# Patient Record
Sex: Male | Born: 1935 | Race: White | Hispanic: No | Smoking: Former smoker
Health system: Southern US, Community
[De-identification: ages and names within clinical notes are randomized; demographics above are authoritative.]

## PROBLEM LIST (undated history)

## (undated) DIAGNOSIS — I509 Heart failure, unspecified: Secondary | ICD-10-CM

## (undated) DIAGNOSIS — M4800 Spinal stenosis, site unspecified: Secondary | ICD-10-CM

## (undated) DIAGNOSIS — H4020X0 Unspecified primary angle-closure glaucoma, stage unspecified: Secondary | ICD-10-CM

## (undated) DIAGNOSIS — T754XXA Electrocution, initial encounter: Secondary | ICD-10-CM

## (undated) DIAGNOSIS — E119 Type 2 diabetes mellitus without complications: Secondary | ICD-10-CM

## (undated) DIAGNOSIS — I4892 Unspecified atrial flutter: Secondary | ICD-10-CM

## (undated) DIAGNOSIS — N200 Calculus of kidney: Secondary | ICD-10-CM

## (undated) DIAGNOSIS — N319 Neuromuscular dysfunction of bladder, unspecified: Secondary | ICD-10-CM

## (undated) DIAGNOSIS — M1990 Unspecified osteoarthritis, unspecified site: Secondary | ICD-10-CM

## (undated) DIAGNOSIS — I2510 Atherosclerotic heart disease of native coronary artery without angina pectoris: Secondary | ICD-10-CM

## (undated) HISTORY — PX: BILATERAL HEMI SHOULDER ARTHROPLASTY: SHX6442

## (undated) HISTORY — PX: GLAUCOMA SURGERY: SHX656

## (undated) HISTORY — PX: LUMBAR SPINE SURGERY: SHX701

## (undated) HISTORY — PX: CYSTOSCOPY: SUR368

---

## 1990-08-14 HISTORY — PX: BALLOON ANGIOPLASTY, ARTERY: SHX564

## 1994-08-14 HISTORY — PX: CORONARY ARTERY BYPASS GRAFT: SHX141

## 1998-04-07 ENCOUNTER — Encounter: Admission: RE | Admit: 1998-04-07 | Discharge: 1998-06-30 | Payer: Self-pay | Admitting: Anesthesiology

## 1998-04-28 ENCOUNTER — Encounter: Payer: Self-pay | Admitting: Anesthesiology

## 1998-05-13 ENCOUNTER — Encounter: Admission: RE | Admit: 1998-05-13 | Discharge: 1998-08-11 | Payer: Self-pay | Admitting: Anesthesiology

## 1998-05-17 ENCOUNTER — Encounter: Payer: Self-pay | Admitting: Anesthesiology

## 1998-06-30 ENCOUNTER — Encounter: Admission: RE | Admit: 1998-06-30 | Discharge: 1998-09-16 | Payer: Self-pay | Admitting: Anesthesiology

## 1998-09-16 ENCOUNTER — Encounter: Admission: RE | Admit: 1998-09-16 | Discharge: 1998-12-15 | Payer: Self-pay | Admitting: Anesthesiology

## 1998-11-30 ENCOUNTER — Encounter: Payer: Self-pay | Admitting: Anesthesiology

## 1998-12-14 ENCOUNTER — Encounter: Payer: Self-pay | Admitting: Anesthesiology

## 1998-12-24 ENCOUNTER — Encounter: Admission: RE | Admit: 1998-12-24 | Discharge: 1999-03-14 | Payer: Self-pay | Admitting: Anesthesiology

## 1999-03-15 ENCOUNTER — Encounter: Admission: RE | Admit: 1999-03-15 | Discharge: 1999-06-13 | Payer: Self-pay | Admitting: Anesthesiology

## 1999-04-13 ENCOUNTER — Encounter: Payer: Self-pay | Admitting: Neurosurgery

## 1999-04-13 ENCOUNTER — Ambulatory Visit (HOSPITAL_COMMUNITY): Admission: RE | Admit: 1999-04-13 | Discharge: 1999-04-13 | Payer: Self-pay | Admitting: Neurosurgery

## 2000-03-29 ENCOUNTER — Encounter: Admission: RE | Admit: 2000-03-29 | Discharge: 2000-03-29 | Payer: Self-pay | Admitting: Neurosurgery

## 2000-03-29 ENCOUNTER — Encounter: Payer: Self-pay | Admitting: Neurosurgery

## 2001-01-03 ENCOUNTER — Ambulatory Visit (HOSPITAL_COMMUNITY): Admission: RE | Admit: 2001-01-03 | Discharge: 2001-01-03 | Payer: Self-pay | Admitting: Neurosurgery

## 2001-01-03 ENCOUNTER — Encounter: Payer: Self-pay | Admitting: Neurosurgery

## 2001-02-11 ENCOUNTER — Ambulatory Visit (HOSPITAL_COMMUNITY): Admission: RE | Admit: 2001-02-11 | Discharge: 2001-02-11 | Payer: Self-pay | Admitting: Neurosurgery

## 2001-02-11 ENCOUNTER — Encounter: Payer: Self-pay | Admitting: Neurosurgery

## 2003-01-24 ENCOUNTER — Encounter: Payer: Self-pay | Admitting: Neurosurgery

## 2003-01-24 ENCOUNTER — Ambulatory Visit (HOSPITAL_COMMUNITY): Admission: RE | Admit: 2003-01-24 | Discharge: 2003-01-24 | Payer: Self-pay | Admitting: Neurosurgery

## 2008-02-03 ENCOUNTER — Encounter: Admission: RE | Admit: 2008-02-03 | Discharge: 2008-02-03 | Payer: Self-pay | Admitting: Neurosurgery

## 2008-06-09 ENCOUNTER — Inpatient Hospital Stay (HOSPITAL_COMMUNITY): Admission: RE | Admit: 2008-06-09 | Discharge: 2008-06-13 | Payer: Self-pay | Admitting: Neurosurgery

## 2009-04-13 ENCOUNTER — Encounter: Admission: RE | Admit: 2009-04-13 | Discharge: 2009-04-13 | Payer: Self-pay | Admitting: Neurosurgery

## 2010-12-27 NOTE — H&P (Signed)
[name], [name] NO.:  [id number]   MEDICAL RECORD NO.:  [id number]          PATIENT TYPE:  INP   LOCATION:  3302                         FACILITY:  [facility]   PHYSICIAN:  Roger Curry, M.D.      DATE OF BIRTH:  [birth year]   DATE OF ADMISSION:  06/09/2008  DATE OF DISCHARGE:                              HISTORY & PHYSICAL   ADMITTING DIAGNOSIS:  Spondylosis, C3-4.   BODY TEXT:  This is a 76 year old right-handed white gentleman who in  1997 underwent a fusion of 4-5 and 5-1, and he has done well, developed  more claudicatory complaints and back pain.  MRI shows spondylosis at 3-  4 with spinal stenosis, and he is admitted now for extension of his  fusion up to 3-4.   Medical history is remarkable for electrocution injury, hypertension,  and coronary artery disease.   He has had a bypass and has stents in his heart.   He is allergic to PENICILLIN and QUININE.   He is currently on several cardiac medications and antihypertensives.   FAMILY HISTORY:  Mother died at 10, and father died at 51.   SOCIAL HISTORY:  Does not smoke, never has, and does not drink alcohol.  He is retired on disability from his electrocution injury.   REVIEW OF SYSTEMS:  Remarkable for back and leg pain.   PHYSICAL EXAMINATION:  HEENT:  Within normal limits.  NECK:  He has good range of motion of neck.  CHEST:  Clear.  CARDIAC:  He has a regular rate and rhythm at this time.  ABDOMEN:  Nontender.  No hepatosplenomegaly.  EXTREMITIES:  Without clubbing or cyanosis.  Peripheral pulses are good.  GENITOURINARY:  Deferred.  NEUROLOGIC:  He is awake, alert, and oriented.  His cranial nerves are  intact.  Motor exam shows 5/5 strength throughout the upper and lower  extremities.  After he has been up walking for a bit, he has difficulty  picking up his feet.  He is areflexic in lower extremities, and numbness  is in a nondermatomal pattern in the lower extremities after he has been  up for a bit.   MRI results have been reviewed above, basically showed tight stenosis at  3-4 with spondylitic change.   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   The plan is for C3-4 fusion.  The risks and benefits have been discussed  with him.  He wished to proceed.           ______________________________  Roger Curry, M.D.     MWR/MEDQ  D:  06/09/2008  T:  06/09/2008  Job:  [id number]

## 2010-12-27 NOTE — Discharge Summary (Signed)
[name], [name] NO.:  [id number]   MEDICAL RECORD NO.:  [id number]          PATIENT TYPE:  INP   LOCATION:  3012                         FACILITY:  [facility]   PHYSICIAN:  Stefani Dama, M.D.  DATE OF BIRTH:  March 29, 1935   DATE OF ADMISSION:  06/09/2008  DATE OF DISCHARGE:  06/13/2008                               DISCHARGE SUMMARY   ADMITTING DIAGNOSES:  Lumbar spondylosis L3-L4 with lumbar spinal  stenosis and radiculopathy.   DISCHARGE AND FINAL DIAGNOSES:  1. Lumbar spondylosis L3-L4 with spinal stenosis and lumbar      radiculopathy.  2. Diabetes mellitus.  3. Previous surgical intervention x4 on lumbar spine.  4. Coronary artery disease.   CONDITION ON DISCHARGE:  Improving.   HOSPITAL COURSE:  Mr. Roger Curry is a 76 year old individual who has  had significant degenerative changes in his back having had four  previous surgical interventions who has evidence of coronary artery  disease in addition to moderate diabetes, which is controlled with diet  and oral medication.  He was admitted to the hospital where he underwent  surgical decompression at L3-L4 with stabilization using interbody  grafting and pedicle screw fixation.  Postoperatively, the patient was  gradually mobilized and he has done reasonably well in terms of pain  control.  His sugars had remained stable and he has had some  hypoglycemia.  He is eager to get to the house and resume his normal  medications and normal activity level.  He is in a brace and his  incision is clean and dry.  He has had some dressing changes secondary  to some bleed through.  He is being given a prescription for Percocet  10/325, #60, and he is to follow up in 10 days' time for suture removal.   CONDITION ON DISCHARGE:  Stable.      Stefani Dama, M.D.  Electronically Signed     HJE/MEDQ  D:  06/13/2008  T:  06/13/2008  Job:  [id number]

## 2010-12-27 NOTE — Op Note (Signed)
NAMEJIOVANNY, Roger Curry NO.:  [id number]   MEDICAL RECORD NO.:  [id number]          PATIENT TYPE:  INP   LOCATION:  3302                         FACILITY:  [facility]   PHYSICIAN:  Payton Doughty, M.D.      DATE OF BIRTH:  02/14/1935   DATE OF PROCEDURE:  06/09/2008  DATE OF DISCHARGE:                               OPERATIVE REPORT   PREOPERATIVE DIAGNOSIS:  Spondylosis L3-4.   POSTOPERATIVE DIAGNOSIS:  Spondylosis L3-4.   PROCEDURE:  L3-4 laminectomy, diskectomy, posterior lumbar interbody  fusion with Ray threaded fusion cages and posterolateral arthrodesis  with 90D and Actifuse.   SURGEON:  Payton Doughty, MD   ANESTHESIA:  General endotracheal.   PREPARATION:  Prepped and draped with alcohol wipe.   COMPLICATIONS:  None.   NURSE ASSISTANT:  [place].   DOCTOR ASSISTANT:  Clydene Fake, MD   BODY OF TEXT:  This is a 76 year old gentleman with adjacent segment  disease at L3-4.  He was taken to operating room, smoothly anesthetized,  and intubated, placed prone on the operating table.  Pressure points  padded.  Following shave, prep, and drape in usual sterile fashion, skin  was incised from mid L2 to mid L4 and the lamina of L3 was exposed  bilaterally in subperiosteal plane as well as the transverse process of  L3 and transverse process of L4.  Having confirmed correctness level  with x-ray, the pars reticularis lamina and inferior facet of L3 and  superior facet of L4 were removed bilaterally using a high-speed drill.  This allowed access to the disk space as well as decompression of the L3  and L4 roots as they traversed this area.  Following complete  decompression and diskectomy, Ray threaded fusion cages 14 x 21 mm were  placed bilaterally.  There were packed with bone graft harvested from  the facet joints.  The transverse processes of L3 and L4 were  decorticated with a high-speed drill and packed with OP-1 on the  Actifuse matrix.  X-ray showed  good placement of cages.  Successive  layers of 0 Vicryl, 2-0 Vicryl, and 3-0 nylon were used to close.  Betadine and Telfa dressing were applied, made occlusive with OpSite.  The patient returned to recovery room in good condition.           ______________________________  Payton Doughty, M.D.     MWR/MEDQ  D:  06/09/2008  T:  06/10/2008  Job:  [id number]

## 2011-05-15 LAB — GLUCOSE, CAPILLARY
Glucose-Capillary: 156 — ABNORMAL HIGH
Glucose-Capillary: 186 — ABNORMAL HIGH
Glucose-Capillary: 187 — ABNORMAL HIGH
Glucose-Capillary: 203 — ABNORMAL HIGH
Glucose-Capillary: 212 — ABNORMAL HIGH
Glucose-Capillary: 254 — ABNORMAL HIGH
Glucose-Capillary: 264 — ABNORMAL HIGH
Glucose-Capillary: 52 — ABNORMAL LOW
Glucose-Capillary: 73
Glucose-Capillary: 94

## 2011-05-15 LAB — COMPREHENSIVE METABOLIC PANEL
AST: 38 — ABNORMAL HIGH
Albumin: 4.9
BUN: 18
Calcium: 9.7
Chloride: 105
Creatinine, Ser: 1.24
GFR calc Af Amer: >60
Total Protein: 7.7

## 2011-05-15 LAB — DIFFERENTIAL
Eosinophils Relative: 4
Lymphocytes Relative: 28
Lymphs Abs: 2.3
Monocytes Absolute: 0.6
Monocytes Relative: 7
Neutro Abs: 5.2

## 2011-05-15 LAB — CBC
MCHC: 34
MCV: 92.1
Platelets: 158
RDW: 13.2
WBC: 8.5

## 2011-05-15 LAB — APTT: aPTT: 33

## 2011-05-15 LAB — TYPE AND SCREEN: ABO/RH(D): O NEG

## 2011-05-15 LAB — URINALYSIS, ROUTINE W REFLEX MICROSCOPIC
Glucose, UA: NEGATIVE
Ketones, ur: NEGATIVE
Nitrite: NEGATIVE
Protein, ur: NEGATIVE
pH: 6

## 2011-05-15 LAB — ABO/RH: ABO/RH(D): O NEG

## 2012-08-14 HISTORY — PX: INSERTION OF ICD: SHX6689

## 2012-08-14 HISTORY — PX: ESOPHAGOGASTRODUODENOSCOPY: SHX1529

## 2013-11-10 DIAGNOSIS — I509 Heart failure, unspecified: Secondary | ICD-10-CM | POA: Insufficient documentation

## 2013-11-10 DIAGNOSIS — N209 Urinary calculus, unspecified: Secondary | ICD-10-CM | POA: Insufficient documentation

## 2013-11-10 DIAGNOSIS — M5137 Other intervertebral disc degeneration, lumbosacral region: Secondary | ICD-10-CM | POA: Insufficient documentation

## 2013-11-10 DIAGNOSIS — M5126 Other intervertebral disc displacement, lumbar region: Secondary | ICD-10-CM | POA: Insufficient documentation

## 2014-01-26 DIAGNOSIS — M961 Postlaminectomy syndrome, not elsewhere classified: Secondary | ICD-10-CM | POA: Insufficient documentation

## 2014-01-26 DIAGNOSIS — E1140 Type 2 diabetes mellitus with diabetic neuropathy, unspecified: Secondary | ICD-10-CM | POA: Insufficient documentation

## 2014-01-26 DIAGNOSIS — M48061 Spinal stenosis, lumbar region without neurogenic claudication: Secondary | ICD-10-CM | POA: Insufficient documentation

## 2014-10-26 DIAGNOSIS — G5603 Carpal tunnel syndrome, bilateral upper limbs: Secondary | ICD-10-CM | POA: Insufficient documentation

## 2015-03-11 DIAGNOSIS — Z95810 Presence of automatic (implantable) cardiac defibrillator: Secondary | ICD-10-CM | POA: Insufficient documentation

## 2015-10-04 ENCOUNTER — Encounter: Payer: Self-pay | Admitting: Physician Assistant

## 2015-10-04 DIAGNOSIS — E785 Hyperlipidemia, unspecified: Secondary | ICD-10-CM | POA: Insufficient documentation

## 2015-10-04 DIAGNOSIS — N400 Enlarged prostate without lower urinary tract symptoms: Secondary | ICD-10-CM | POA: Insufficient documentation

## 2015-10-04 DIAGNOSIS — N183 Chronic kidney disease, stage 3 unspecified: Secondary | ICD-10-CM | POA: Insufficient documentation

## 2015-10-04 DIAGNOSIS — N319 Neuromuscular dysfunction of bladder, unspecified: Secondary | ICD-10-CM | POA: Insufficient documentation

## 2015-10-04 DIAGNOSIS — E538 Deficiency of other specified B group vitamins: Secondary | ICD-10-CM | POA: Insufficient documentation

## 2015-10-04 DIAGNOSIS — M539 Dorsopathy, unspecified: Secondary | ICD-10-CM | POA: Insufficient documentation

## 2015-10-04 DIAGNOSIS — I059 Rheumatic mitral valve disease, unspecified: Secondary | ICD-10-CM | POA: Insufficient documentation

## 2015-10-04 DIAGNOSIS — I429 Cardiomyopathy, unspecified: Secondary | ICD-10-CM | POA: Insufficient documentation

## 2015-10-04 DIAGNOSIS — G63 Polyneuropathy in diseases classified elsewhere: Secondary | ICD-10-CM | POA: Insufficient documentation

## 2015-10-04 DIAGNOSIS — K219 Gastro-esophageal reflux disease without esophagitis: Secondary | ICD-10-CM | POA: Insufficient documentation

## 2015-10-04 DIAGNOSIS — Z951 Presence of aortocoronary bypass graft: Secondary | ICD-10-CM | POA: Insufficient documentation

## 2015-10-04 DIAGNOSIS — E1122 Type 2 diabetes mellitus with diabetic chronic kidney disease: Secondary | ICD-10-CM | POA: Insufficient documentation

## 2015-10-04 NOTE — Consult Note (Signed)
                                                                           [facility] Consult: 11:13 AM 10/05/2015  LOS: 0 days    Referring Provider: Dr Merrell  Primary Care Physician:  No PCP Per Patient Primary Gastroenterologist:  Unassigned.       Reason for Consultation:  choledocholithiais and biliary pancreatitis   HPI: Roger Curry is a 81 y.o. male.  PMH of MI, CAD: s/p angioplasty (he denies stent placement) and then 4 vsl CABG.  S/p valve replacement .  CHF.  S/p biventricular ICD. Type 2 DM.  S/p lumbar spine surgeries x 4 2009.  Post laminectomy syndrome.  Electrocution injury.  Kidney stones.  Bladder outlet issues, requiring self catheterization 4 x per day.  Glaucoma s/p metal implants into both eyes.  He can not undergo MRI due to metal in place.  S/p EGD by [place] MD  ~ 2014: no ulcer.  On PPI at home.  No previous colonoscopy.  Pt is not on Xarelto, in contradiction to medical notes .  This is confirmed by a call to his PMD office.   Sunday night onset of watery dairrhea, no blood or melena, "all night long".  uper abdominal pain became intense, diffuse and radiated to his back and chest.  Inhibited breathing.  Little nausea.  + dizzy with position change.  Monday seen by PMD  and sent to [place] ED where lipase 8495.  T bili 4.  AST/ALT 631/471.  Alk phos 111.  WBC 16 K.    These are all declining this AM.   Renal function wnl.  Pain is fairly well controlled with Morphine.  CT scan with 4 mm CBD stone, cholecystitis, peri-pancreatic haziness Platelets 116, wbc 8.2.  Hgb 11  Pt does not drink ETOH   Past Medical History  Diagnosis Date  . Spinal stenosis     lumbar and cervical.   . CAD (coronary artery disease)   . Electrocution   . Type 2 diabetes mellitus (HCC)   . Bladder dysfunction     self  caths 4 x daily.   . Closed angle glaucoma   .  Renal stones   . CHF (congestive heart failure) (HCC)     EF not known.     Past Surgical History  Procedure Laterality Date  . Lumbar spine surgery  1997, 2009    1997 l spine fusion.  2009 lumbar laminectomy, discectomy, fusion.  says he has had total of 4 surgeries.  . Coronary artery bypass graft  1996  . Valve replacement  2014    unknown type but biologic, not on blood thinner  . Insertion of icd  2014  . Balloon angioplasty, artery  1992  . Glaucoma surgery    . Esophagogastroduodenoscopy  2014    in [place]  . Cystoscopy      Prior to Admission medications   Not on File  Reviewed,  Unable to import list into my note. xarelto not on list.    Scheduled Meds: . imipenem-cilastatin  500 mg Intravenous Q8H  . insulin aspart  0-9 Units Subcutaneous Q4H  . pantoprazole (  PROTONIX) IV  40 mg Intravenous Q24H   Continuous Infusions: . sodium chloride 100 mL/hr at 10/05/15 0504   PRN Meds:.hydrALAZINE, HYDROmorphone (DILAUDID) injection, ondansetron **OR** ondansetron (ZOFRAN) IV       Allergies as of 10/04/2015 - never reviewed  Allergen Reaction Noted  . Penicillins  10/04/2015  . Quinine derivatives  10/04/2015    Family History  Problem Relation Age of Onset  . Stroke Brother   . Hypertension Brother     Social History   Social History  . Marital Status: Divorced    Spouse Name: N/A  . Number of Children: N/A  . Years of Education: N/A   Occupational History  . Not on file.   Social History Main Topics  . Smoking status: Former Smoker  . Smokeless tobacco: Not on file  . Alcohol Use: No  . Drug Use: No  . Sexual Activity: Not on file   Other Topics Concern  . Not on file   Social History Narrative    REVIEW OF SYSTEMS: Constitutional:  Weight stable ENT:  No nose bleeds.  Dry mouth.   Pulm:  Per HPI CV:  No palpitations, no LE edema.  GU:  No hematuria, no frequency GI:  Per HPI.  Normally no signif issues with pyrosis, dysphagia,  diarrhea, constipation Heme:  No unusual bleeding or bruising   Transfusions:  None per his recall Neuro:  No headaches, no peripheral tingling or numbness Derm:  No itching, no rash or sores.  Endocrine:  No sweats or chills.  No polyuria or dysuria Immunization:  Not queried Travel:  None beyond local counties in last few months.    PHYSICAL EXAM: Vital signs in last 24 hours: Filed Vitals:   10/05/15 0026 10/05/15 0500  BP: 147/62 154/60  Pulse: 76 80  Temp: 97.5 F (36.4 C) 98 F (36.7 C)  Resp: 18 18   Wt Readings from Last 3 Encounters:  10/05/15 97.251 kg (214 lb 6.4 oz)    General: uncomfortable, looks ill,  Head:  No swelling or asymmetry  Eyes:  No icterus or pallor Ears:  Somewhat HOH  Nose:  No discharge Mouth:  dry Neck:  No jvd or mass  No tmg Lungs:  Crackles in left base.  Heart: RRR.  No mrg.  S1/s2 audible Abdomen:  Soft, distended, diffusely tender, BS hypoactive.  .   Rectal: deferred   Musc/Skeltl: no joint swelling or deformity Extremities:  No LE edema  Neurologic:  Oriented x 3.  Tremor in 4 limbs.  Moves lll limbs, strength not tested Skin:  Sebaceous cysts on his back Tattoos:  None seen Nodes:  No cervical adenopathy   Psych:  Cooperative , pleasant.   Intake/Output from previous day:   Intake/Output this shift: Total I/O In: -  Out: 700 [Urine:700]  LAB RESULTS:  Recent Labs  10/05/15 0118  WBC 8.2  HGB 11.1*  HCT 34.5*  PLT 116*   BMET Lab Results  Component Value Date   NA 142 10/05/2015   NA 140 06/03/2008   K 4.1 10/05/2015   K 4.6 06/03/2008   CL 105 10/05/2015   CL 105 06/03/2008   CO2 27 10/05/2015   CO2 27 06/03/2008   GLUCOSE 86 10/05/2015   GLUCOSE 85 06/03/2008   BUN 16 10/05/2015   BUN 18 06/03/2008   CREATININE 1.24 10/05/2015   CREATININE 1.24 06/03/2008   CALCIUM 8.4* 10/05/2015   CALCIUM 9.7 06/03/2008   LFT  Recent Labs    10/05/15 0118  PROT 6.3*  ALBUMIN 3.4*  AST 222*  ALT 283*    ALKPHOS 95  BILITOT 4.0*  BILIDIR 2.3*  IBILI 1.7*   PT/INR Lab Results  Component Value Date   INR 1.06 10/05/2015   INR 1.0 06/03/2008   Hepatitis Panel No results for input(s): HEPBSAG, HCVAB, HEPAIGM, HEPBIGM in the last 72 hours. C-Diff No components found for: CDIFF Lipase     Component Value Date/Time   LIPASE 382* 10/05/2015 0118    Drugs of Abuse  No results found for: LABOPIA, COCAINSCRNUR, LABBENZ, AMPHETMU, THCU, LABBARB   RADIOLOGY STUDIES: Dg Chest Port 1 View  10/05/2015  CLINICAL DATA:  Shortness of Breath EXAM: PORTABLE CHEST 1 VIEW COMPARISON:  10/04/2015 FINDINGS: Cardiac shadow is stable. Postoperative changes and defibrillator are again noted and stable. Mild increased central vascular congestion is noted without pulmonary edema. No focal infiltrate is seen. No bony abnormality is noted. IMPRESSION: Increased central vascular congestion without significant pulmonary edema. Electronically Signed   By: Mark  Lukens M.D.   On: 10/05/2015 07:50    ENDOSCOPIC STUDIES: Per HPI  IMPRESSION:   *  Acute biliary pancreatitis and choledocholithiasis.  Labs improved within 24 hours.    *  Thrombocytopenia.  *  Hx CHF and unknown ef.  S/p heart valve and ICD.  No details on the location of valve but as not on thinner presume it is biologic.   cxr shows early congestion.     *  Mild, stable Troponin elevation to 0.04 x 2.      PLAN:     *  ERCP with MAC Wednesday in early afternoon  *  Ice chips. .   *  ECHO ordered.    *  Trend lipase and cmet.  *  primaxin continues.  Add daily PPI.   *  May need repeat cxr if increased resp compromise  *  Hospital md needs to call general surgery consult.    Davied Nocito  10/05/2015, 11:13 AM Pager: [phone]      

## 2015-10-05 ENCOUNTER — Inpatient Hospital Stay (HOSPITAL_COMMUNITY): Payer: Medicare HMO

## 2015-10-05 ENCOUNTER — Inpatient Hospital Stay (HOSPITAL_COMMUNITY)
Admission: AD | Admit: 2015-10-05 | Discharge: 2015-10-09 | DRG: 418 | Disposition: A | Discharge status: Home / Self Care | Payer: Medicare HMO | Source: Other Acute Inpatient Hospital | Attending: Internal Medicine | Admitting: Internal Medicine

## 2015-10-05 ENCOUNTER — Encounter (HOSPITAL_COMMUNITY): Payer: Self-pay | Admitting: Internal Medicine

## 2015-10-05 DIAGNOSIS — R740 Nonspecific elevation of levels of transaminase and lactic acid dehydrogenase [LDH]: Secondary | ICD-10-CM | POA: Diagnosis present

## 2015-10-05 DIAGNOSIS — I4891 Unspecified atrial fibrillation: Secondary | ICD-10-CM | POA: Diagnosis present

## 2015-10-05 DIAGNOSIS — Z888 Allergy status to other drugs, medicaments and biological substances status: Secondary | ICD-10-CM

## 2015-10-05 DIAGNOSIS — R109 Unspecified abdominal pain: Secondary | ICD-10-CM | POA: Diagnosis present

## 2015-10-05 DIAGNOSIS — K5710 Diverticulosis of small intestine without perforation or abscess without bleeding: Secondary | ICD-10-CM | POA: Diagnosis present

## 2015-10-05 DIAGNOSIS — E876 Hypokalemia: Secondary | ICD-10-CM | POA: Diagnosis present

## 2015-10-05 DIAGNOSIS — Z8249 Family history of ischemic heart disease and other diseases of the circulatory system: Secondary | ICD-10-CM | POA: Diagnosis not present

## 2015-10-05 DIAGNOSIS — E785 Hyperlipidemia, unspecified: Secondary | ICD-10-CM | POA: Diagnosis not present

## 2015-10-05 DIAGNOSIS — N200 Calculus of kidney: Secondary | ICD-10-CM | POA: Diagnosis present

## 2015-10-05 DIAGNOSIS — H4020X0 Unspecified primary angle-closure glaucoma, stage unspecified: Secondary | ICD-10-CM | POA: Diagnosis present

## 2015-10-05 DIAGNOSIS — G8929 Other chronic pain: Secondary | ICD-10-CM | POA: Diagnosis present

## 2015-10-05 DIAGNOSIS — Z7982 Long term (current) use of aspirin: Secondary | ICD-10-CM

## 2015-10-05 DIAGNOSIS — E119 Type 2 diabetes mellitus without complications: Secondary | ICD-10-CM

## 2015-10-05 DIAGNOSIS — Z79899 Other long term (current) drug therapy: Secondary | ICD-10-CM

## 2015-10-05 DIAGNOSIS — Z66 Do not resuscitate: Secondary | ICD-10-CM | POA: Diagnosis present

## 2015-10-05 DIAGNOSIS — I2510 Atherosclerotic heart disease of native coronary artery without angina pectoris: Secondary | ICD-10-CM | POA: Diagnosis present

## 2015-10-05 DIAGNOSIS — Z880 Allergy status to penicillin: Secondary | ICD-10-CM

## 2015-10-05 DIAGNOSIS — D696 Thrombocytopenia, unspecified: Secondary | ICD-10-CM | POA: Diagnosis present

## 2015-10-05 DIAGNOSIS — Z955 Presence of coronary angioplasty implant and graft: Secondary | ICD-10-CM | POA: Diagnosis not present

## 2015-10-05 DIAGNOSIS — Z95810 Presence of automatic (implantable) cardiac defibrillator: Secondary | ICD-10-CM

## 2015-10-05 DIAGNOSIS — I10 Essential (primary) hypertension: Secondary | ICD-10-CM | POA: Diagnosis present

## 2015-10-05 DIAGNOSIS — K8064 Calculus of gallbladder and bile duct with chronic cholecystitis without obstruction: Secondary | ICD-10-CM | POA: Diagnosis present

## 2015-10-05 DIAGNOSIS — Z981 Arthrodesis status: Secondary | ICD-10-CM | POA: Diagnosis not present

## 2015-10-05 DIAGNOSIS — Z951 Presence of aortocoronary bypass graft: Secondary | ICD-10-CM

## 2015-10-05 DIAGNOSIS — I255 Ischemic cardiomyopathy: Secondary | ICD-10-CM | POA: Diagnosis not present

## 2015-10-05 DIAGNOSIS — M4800 Spinal stenosis, site unspecified: Secondary | ICD-10-CM | POA: Diagnosis not present

## 2015-10-05 DIAGNOSIS — R0602 Shortness of breath: Secondary | ICD-10-CM

## 2015-10-05 DIAGNOSIS — I5022 Chronic systolic (congestive) heart failure: Secondary | ICD-10-CM | POA: Diagnosis present

## 2015-10-05 DIAGNOSIS — Z8679 Personal history of other diseases of the circulatory system: Secondary | ICD-10-CM | POA: Diagnosis not present

## 2015-10-05 DIAGNOSIS — K851 Biliary acute pancreatitis without necrosis or infection: Secondary | ICD-10-CM | POA: Diagnosis present

## 2015-10-05 DIAGNOSIS — Z01810 Encounter for preprocedural cardiovascular examination: Secondary | ICD-10-CM | POA: Diagnosis not present

## 2015-10-05 DIAGNOSIS — D649 Anemia, unspecified: Secondary | ICD-10-CM | POA: Diagnosis not present

## 2015-10-05 DIAGNOSIS — Z87891 Personal history of nicotine dependence: Secondary | ICD-10-CM | POA: Diagnosis not present

## 2015-10-05 DIAGNOSIS — R339 Retention of urine, unspecified: Secondary | ICD-10-CM | POA: Diagnosis not present

## 2015-10-05 DIAGNOSIS — R7401 Elevation of levels of liver transaminase levels: Secondary | ICD-10-CM | POA: Diagnosis present

## 2015-10-05 DIAGNOSIS — K8050 Calculus of bile duct without cholangitis or cholecystitis without obstruction: Secondary | ICD-10-CM

## 2015-10-05 HISTORY — DX: Type 2 diabetes mellitus without complications: E11.9

## 2015-10-05 HISTORY — DX: Neuromuscular dysfunction of bladder, unspecified: N31.9

## 2015-10-05 HISTORY — DX: Unspecified primary angle-closure glaucoma, stage unspecified: H40.20X0

## 2015-10-05 HISTORY — DX: Electrocution, initial encounter: T75.4XXA

## 2015-10-05 HISTORY — DX: Spinal stenosis, site unspecified: M48.00

## 2015-10-05 HISTORY — DX: Unspecified atrial flutter: I48.92

## 2015-10-05 HISTORY — DX: Heart failure, unspecified: I50.9

## 2015-10-05 HISTORY — DX: Atherosclerotic heart disease of native coronary artery without angina pectoris: I25.10

## 2015-10-05 HISTORY — DX: Calculus of kidney: N20.0

## 2015-10-05 LAB — HEPATIC FUNCTION PANEL
ALT: 283 U/L — AB (ref 17–63)
AST: 222 U/L — ABNORMAL HIGH (ref 15–41)
Albumin: 3.4 g/dL — ABNORMAL LOW (ref 3.5–5.0)
Alkaline Phosphatase: 95 U/L (ref 38–126)
BILIRUBIN DIRECT: 2.3 mg/dL — AB (ref 0.1–0.5)
BILIRUBIN INDIRECT: 1.7 mg/dL — AB (ref 0.3–0.9)
Total Bilirubin: 4 mg/dL — ABNORMAL HIGH (ref 0.3–1.2)
Total Protein: 6.3 g/dL — ABNORMAL LOW (ref 6.5–8.1)

## 2015-10-05 LAB — CBC WITH DIFFERENTIAL/PLATELET
Basophils Absolute: 0 10*3/uL (ref 0.0–0.1)
Basophils Relative: 0 %
EOS ABS: 0.2 10*3/uL (ref 0.0–0.7)
EOS PCT: 3 %
HCT: 34.5 % — ABNORMAL LOW (ref 39.0–52.0)
Hemoglobin: 11.1 g/dL — ABNORMAL LOW (ref 13.0–17.0)
LYMPHS ABS: 0.9 10*3/uL (ref 0.7–4.0)
Lymphocytes Relative: 11 %
MCH: 29.1 pg (ref 26.0–34.0)
MCHC: 32.2 g/dL (ref 30.0–36.0)
MCV: 90.3 fL (ref 78.0–100.0)
MONOS PCT: 9 %
Monocytes Absolute: 0.7 10*3/uL (ref 0.1–1.0)
Neutro Abs: 6.4 10*3/uL (ref 1.7–7.7)
Neutrophils Relative %: 78 %
PLATELETS: 116 10*3/uL — AB (ref 150–400)
RBC: 3.82 MIL/uL — ABNORMAL LOW (ref 4.22–5.81)
RDW: 14.5 % (ref 11.5–15.5)
WBC: 8.2 10*3/uL (ref 4.0–10.5)

## 2015-10-05 LAB — TROPONIN I
TROPONIN I: 0.03 ng/mL (ref <0.031)
Troponin I: 0.04 ng/mL — ABNORMAL HIGH (ref <0.031)
Troponin I: 0.04 ng/mL — ABNORMAL HIGH (ref <0.031)
Troponin I: 0.03 ng/mL (ref <0.031)

## 2015-10-05 LAB — BASIC METABOLIC PANEL
Anion gap: 10 (ref 5–15)
BUN: 16 mg/dL (ref 6–20)
CHLORIDE: 105 mmol/L (ref 101–111)
CO2: 27 mmol/L (ref 22–32)
CREATININE: 1.24 mg/dL (ref 0.61–1.24)
Calcium: 8.4 mg/dL — ABNORMAL LOW (ref 8.9–10.3)
GFR calc Af Amer: >60 mL/min (ref >60)
GFR calc non Af Amer: 54 mL/min — ABNORMAL LOW (ref >60)
Glucose, Bld: 86 mg/dL (ref 65–99)
Potassium: 4.1 mmol/L (ref 3.5–5.1)
SODIUM: 142 mmol/L (ref 135–145)

## 2015-10-05 LAB — GLUCOSE, CAPILLARY
GLUCOSE-CAPILLARY: 111 mg/dL — AB (ref 65–99)
GLUCOSE-CAPILLARY: 119 mg/dL — AB (ref 65–99)
GLUCOSE-CAPILLARY: 111 mg/dL — AB (ref 65–99)
Glucose-Capillary: 68 mg/dL (ref 65–99)
Glucose-Capillary: 119 mg/dL — ABNORMAL HIGH (ref 65–99)
Glucose-Capillary: 93 mg/dL (ref 65–99)

## 2015-10-05 LAB — LIPASE, BLOOD: LIPASE: 382 U/L — AB (ref 11–51)

## 2015-10-05 LAB — PROTIME-INR
INR: 1.06 (ref 0.00–1.49)
PROTHROMBIN TIME: 14 s (ref 11.6–15.2)

## 2015-10-05 MED ORDER — SODIUM CHLORIDE 0.9 % IV SOLN
500.0000 mg | Freq: Three times a day (TID) | INTRAVENOUS | Status: DC
  Administered 2015-10-05 – 2015-10-07 (×7): 500 mg via INTRAVENOUS
  Filled 2015-10-05 (×10): qty 500

## 2015-10-05 MED ORDER — DEXTROSE 50 % IV SOLN
INTRAVENOUS | Status: AC
  Administered 2015-10-05: 25 mL
  Filled 2015-10-05: qty 50

## 2015-10-05 MED ORDER — PANTOPRAZOLE SODIUM 40 MG IV SOLR
40.0000 mg | INTRAVENOUS | Status: DC
  Administered 2015-10-05 – 2015-10-07 (×3): 40 mg via INTRAVENOUS
  Filled 2015-10-05 (×3): qty 40

## 2015-10-05 MED ORDER — SODIUM CHLORIDE 0.9 % IV SOLN
INTRAVENOUS | Status: AC
  Administered 2015-10-05 – 2015-10-06 (×2): via INTRAVENOUS

## 2015-10-05 MED ORDER — HYDRALAZINE HCL 20 MG/ML IJ SOLN: 10.0000 mg | INTRAMUSCULAR | Status: DC | PRN

## 2015-10-05 MED ORDER — ONDANSETRON HCL 4 MG/2ML IJ SOLN
4.0000 mg | Freq: Four times a day (QID) | INTRAMUSCULAR | Status: DC | PRN
  Administered 2015-10-06: 4 mg via INTRAVENOUS
  Filled 2015-10-05: qty 2

## 2015-10-05 MED ORDER — HYDROMORPHONE HCL 1 MG/ML IJ SOLN
1.0000 mg | INTRAMUSCULAR | Status: DC | PRN
  Administered 2015-10-05 (×4): 1 mg via INTRAVENOUS
  Filled 2015-10-05 (×4): qty 1

## 2015-10-05 MED ORDER — INSULIN ASPART 100 UNIT/ML ~~LOC~~ SOLN
0.0000 [IU] | SUBCUTANEOUS | Status: DC
  Administered 2015-10-06: 2 [IU] via SUBCUTANEOUS
  Administered 2015-10-06 (×2): 1 [IU] via SUBCUTANEOUS
  Administered 2015-10-07: 3 [IU] via SUBCUTANEOUS
  Administered 2015-10-07 (×2): 1 [IU] via SUBCUTANEOUS
  Administered 2015-10-07: 2 [IU] via SUBCUTANEOUS
  Administered 2015-10-07: 3 [IU] via SUBCUTANEOUS
  Administered 2015-10-07 – 2015-10-08 (×2): 2 [IU] via SUBCUTANEOUS
  Administered 2015-10-08: 3 [IU] via SUBCUTANEOUS
  Administered 2015-10-08 – 2015-10-09 (×2): 2 [IU] via SUBCUTANEOUS
  Administered 2015-10-09 (×2): 1 [IU] via SUBCUTANEOUS

## 2015-10-05 MED ORDER — ONDANSETRON HCL 4 MG PO TABS: 4.0000 mg | ORAL_TABLET | Freq: Four times a day (QID) | ORAL | Status: DC | PRN

## 2015-10-05 NOTE — Progress Notes (Signed)
New Admission Note: Pt transfer from [facility] to [phone]  Arrival Method: Via Stretcher Mental Orientation: Alert and oriented x 4  Telemetry: Awaiting Orders Assessment: Completed Skin: Intact IV:  Pain: 8/10 to abd Tubes: None Safety Measures: Safety Fall Prevention Plan has been discussed  Admission: To be completed 6 [place] Orientation: Patient has been orientated to the room, unit and staff.  Family: none at bedside at this time  Page to [facility] Admissions for notification of patients arrival to the floor. Will continue to monitor the patient. Call light has been placed within reach and bed alarm has been activated.   Burley Saver BSN RN-BC Phone: [phone]

## 2015-10-05 NOTE — Progress Notes (Signed)
  Echocardiogram 2D Echocardiogram has been performed.  Arvil Chaco 10/05/2015, 4:46 PM

## 2015-10-05 NOTE — H&P (Signed)
[facility] History and Physical  DYLEN MCELHANNON ZOX:[id number] DOB: 1935-08-05 DOA: 10/05/2015  Referring physician: Patient was transferred from [facility]. PCP: No PCP Per Patient  Specialists: None.  Chief Complaint: Abdominal pain.  HPI: Roger Curry is a 81 y.o. male with history of CAD status post CABG, diabetes mellitus type 2, hypertension, hyperlipidemia and patient is on xarelto probably for atrial fibrillation present to the ER because of severe abdominal pain. Patient's abdominal pain started on Sunday night (10/03/2015). Patient's abdominal pain associated with diarrhea but denies any vomiting. Pain was nonradiating stabbing in nature and denies any associated chest pain or shortness of breath. Patient had gone to his primary care physician who referred to the ER. CT abdomen and pelvis shows CBD stone with pancreatitis and was able to early cholecystitis. AST ALT where the 600s with lipase and 8000. Since patient will need gastroenterology referral patient was transferred to [facility].   Review of Systems: As presented in the history of presenting illness, rest negative.  Past Medical History  Diagnosis Date  . Spinal stenosis   . CAD (coronary artery disease)   . Electrocution   . Type 2 diabetes mellitus [facility])    Past Surgical History  Procedure Laterality Date  . Lumbar spine surgery  1997, 2009    1997 l spine fusion.  2009 lumbar laminectomy, discectomy, fusion  . Coronary artery bypass graft    . Coronary angioplasty with stent placement     Social History:  reports that he has quit smoking. He does not have any smokeless tobacco history on file. He reports that he does not drink alcohol or use illicit drugs. Where does patient live home. Can patient participate in ADLs? Yes.  Allergies  Allergen Reactions  . Penicillins   . Quinine Derivatives     Family History:  Family History  Problem Relation Age of Onset  . Stroke Brother    . Hypertension Brother       Prior to Admission medications   Not on File    Physical Exam: Filed Vitals:   10/05/15 0026  BP: 147/62  Pulse: 76  Temp: 97.5 F (36.4 C)  TempSrc: Oral  Resp: 18  Height:  (1.676 m)  Weight: 97.251 kg (214 lb 6.4 oz)  SpO2: 100%     General:  Moderately built and nourished.  Eyes: Anicteric no pallor.  ENT: No discharge from the ears eyes nose and mouth.  Neck: No mass felt. No JVD appreciated.  Cardiovascular: S1 and S2 heard.  Respiratory: No rhonchi or crepitations.  Abdomen: Soft mild tenderness present. No guarding or rigidity.  Skin: No rash.  Musculoskeletal: No edema.  Psychiatric: Appears normal.  Neurologic: Alert awake oriented to time place and person. Moves all extremities.  Labs on Admission:  Basic Metabolic Panel:  Recent Labs Lab 10/05/15 0118  NA 142  K 4.1  CL 105  CO2 27  GLUCOSE 86  BUN 16  CREATININE 1.24  CALCIUM 8.4*   Liver Function Tests:  Recent Labs Lab 10/05/15 0118  AST 222*  ALT 283*  ALKPHOS 95  BILITOT 4.0*  PROT 6.3*  ALBUMIN 3.4*    Recent Labs Lab 10/05/15 0118  LIPASE 382*   No results for input(s): AMMONIA in the last 168 hours. CBC:  Recent Labs Lab 10/05/15 0118  WBC 8.2  NEUTROABS 6.4  HGB 11.1*  HCT 34.5*  MCV 90.3  PLT 116*   Cardiac Enzymes:  Recent Labs  Lab 10/05/15 0118  TROPONINI 0.04*    BNP (last 3 results) No results for input(s): BNP in the last 8760 hours.  ProBNP (last 3 results) No results for input(s): PROBNP in the last 8760 hours.  CBG: No results for input(s): GLUCAP in the last 168 hours.  Radiological Exams on Admission: No results found.  EKG: At [facility] shows paced rhythm.  Assessment/Plan Principal Problem:   Acute gallstone pancreatitis Active Problems:   Diabetes mellitus type 2, controlled (HCC)   Essential hypertension   CAD (coronary artery disease)   Normocytic normochromic anemia    Acute pancreatitis   1. Acute gallstone pancreatitis with possible early cholecystitis with CBD stone - gastroenterologist at [facility] was already notified. Patient at this time will be kept nothing by mouth and on empiric antibiotics which I have placed patient on Primaxin. Patient's last dose of xarelto was more than 24 hours ago and was on 10/02/2014 evening. Follow LFTs and lipase. Patient eventually will need surgical consult. 2. Diabetes mellitus type 2 - while inpatient and I'll keep patient on sliding scale coverage. 3. CAD status post CABG with history of ischemic cardiomyopathy status post defibrillator placement - denies any chest pain we will cycle cardiac markers. 4. Hypertension - since patient is nothing by mouth we will place patient on when necessary IV hydralazine. 5. Normocytic normochromic anemia - baseline hemoglobin not known. Follow CBC.   DVT Prophylaxis SCDs.  Code Status: DO NOT INTUBATE.  Family Communication: Discussed with patient.  Disposition Plan: Admit to inpatient.    Asianae Minkler N. [facility] Pager [phone].  If 7PM-7AM, please contact night-coverage [email or web address] Password [email or web address] 10/05/2015, 4:03 AM

## 2015-10-05 NOTE — Progress Notes (Signed)
Patient seen and evaluated by MD earlier this AM. GI has evaluated and recommended we consult General surgery. I have reached out to general surgery. Also have obtained patient's cardiologists contact number. Will have nursing obtain his cardiac history given reports of heart valve replacement and other cardiac issues which patient is unable to give me details about.   Of note he has metal implants in his eyes and we are not able to do MRI evaluation per GI notes.  Will reassess next am unless there is an acute medical issue requiring further evaluation.  Kaislyn Gulas, [employer]

## 2015-10-05 NOTE — Progress Notes (Signed)
Utilization review complete. Madhavi Hamblen RN CCM Case Mgmt phone [phone] 

## 2015-10-05 NOTE — Progress Notes (Signed)
Pharmacy Antibiotic Note  Roger Curry is a 81 y.o. male admitted on 10/05/2015 with intra-abdominal infection.  Pharmacy has been consulted for primaxin dosing.  Plan: Primaxin  IV q8h  Monitor culture data, renal function and clinical course  Height:  (167.6 cm) Weight: 214 lb 6.4 oz (97.251 kg) IBW/kg (Calculated) : 63.8  Temp (24hrs), Avg:97.5 F (36.4 C), Min:97.5 F (36.4 C), Max:97.5 F (36.4 C)   Recent Labs Lab 10/05/15 0118  WBC 8.2  CREATININE 1.24    Estimated Creatinine Clearance: 53.6 mL/min (by C-G formula based on Cr of 1.24).    Allergies  Allergen Reactions  . Penicillins   . Quinine Derivatives     Antimicrobials this admission: Primaxin 2/21 >>    >>   Dose adjustments this admission: n/a  Microbiology results:  BCx:   UCx:    Sputum:    MRSA PCR:   Arlean Hopping. Newman Pies, PharmD, BCPS Clinical Pharmacist Pager [phone]  10/05/2015 3:19 AM

## 2015-10-05 NOTE — Consult Note (Signed)
Reason for Consult:  Gallstone pancreatitis Referring Physician:  Dr. Hillary Bow PCP: No PCP Per Patient   Roger Curry is an 81 y.o. male.  HPI: Pt transferred from [facility] with severe abdominal pain.  Pain started 10/03/15 in the PM.  He went to his PCP who then sent him to the ED for evaluation.  Work up at [facility] showed elevated LFT's:T bili 4. AST/ALT 631/471. Alk phos 111. WBC 16 K , lipase listed as at 8495.  CT scan showed CBD stone with pancreatitis and early cholecystitis.  He was transferred to [facility] because no GI care was available at [facility].  He is a limited DNR.  Information is limited in the E chart about findings at [facility].  GI has seen and plan ERCP once cardiac clearance is obtained.  We are also ask to follow for possible cholecystectomy.  He still has generalized tenderness over the abdomen.  No labs this AM.  Troponin was minimally elevated on admit also.    Past Medical History  Diagnosis Date  . Spinal stenosis     lumbar and cervical.   . CAD (coronary artery disease)   . Electrocution   . Type 2 diabetes mellitus ([place])   . Bladder dysfunction     self  caths 4 x daily.   . Closed angle glaucoma   . Renal stones   . CHF (congestive heart failure) (HCC)     EF not known.     Past Surgical History  Procedure Laterality Date  . Lumbar spine surgery  1997, 2009    1997 l spine fusion.  2009 lumbar laminectomy, discectomy, fusion.  says he has had total of 4 surgeries.  . Coronary artery bypass graft  1996  . Valve replacement  2014    unknown type but biologic, not on blood thinner  . Insertion of icd  2014  . Balloon angioplasty, artery  1992  . Glaucoma surgery    . Esophagogastroduodenoscopy  2014    in [facility]  . Cystoscopy      Family History  Problem Relation Age of Onset  . Stroke Brother   . Hypertension Brother     Social History:  reports that he has quit smoking. He does not have any smokeless tobacco history on file. He  reports that he does not drink alcohol or use illicit drugs.  Allergies:  Allergies  Allergen Reactions  . Penicillins Other (See Comments)    Childhood allergy Has patient had a PCN reaction causing immediate rash, facial/tongue/throat swelling, SOB or lightheadedness with hypotension: No  Has patient had a PCN reaction causing severe rash involving mucus membranes or skin necrosis: No Has patient had a PCN reaction that required hospitalization No Has patient had a PCN reaction occurring within the last 10 years: No If all of the above answers are "NO", then may proceed with Cephalosporin use.  . Quinine Derivatives     Medications:  Prior to Admission:  Prescriptions prior to admission  Medication Sig Dispense Refill Last Dose  . amLODipine (NORVASC) 5 MG tablet Take 5 mg by mouth daily.   10/04/2015 at Unknown time  . aspirin EC 81 MG tablet Take 81 mg by mouth daily.   10/04/2015 at Unknown time  . bethanechol (URECHOLINE) 5 MG tablet Take 5 mg by mouth 2 (two) times daily.   10/04/2015 at Unknown time  . cyanocobalamin (,VITAMIN B-12,) 1000 MCG/ML injection Inject 1,000 mcg into the muscle every 30 (thirty)  days.   Past Month at Unknown time  . DULoxetine (CYMBALTA) 60 MG capsule Take 60 mg by mouth daily.   10/04/2015 at Unknown time  . furosemide (LASIX) 40 MG tablet Take 60 mg by mouth 2 (two) times daily.   10/04/2015 at Unknown time  . glimepiride (AMARYL) 4 MG tablet Take 4 mg by mouth daily with breakfast.   10/04/2015 at Unknown time  . lisinopril (PRINIVIL,ZESTRIL) 40 MG tablet Take 20 mg by mouth daily.   10/04/2015 at Unknown time  . Omega-3 Fatty Acids (FISH OIL) 1200 MG CAPS Take 1 capsule by mouth 2 (two) times daily.   10/04/2015 at Unknown time  . omeprazole (PRILOSEC) 20 MG capsule Take 20 mg by mouth daily.   10/04/2015 at Unknown time  . potassium chloride SA (K-DUR,KLOR-CON) 20 MEQ tablet Take 10 mEq by mouth daily.   unknown at Unknown time  . rosuvastatin (CRESTOR)  20 MG tablet Take 10 mg by mouth daily.   10/04/2015 at Unknown time  . tamsulosin (FLOMAX) 0.4 MG CAPS capsule Take 0.4 mg by mouth daily.   10/04/2015 at Unknown time  . traMADol (ULTRAM) 50 MG tablet Take 50 mg by mouth every 6 (six) hours as needed for moderate pain.   Past Month at Unknown time  . triamcinolone cream (KENALOG) 0.1 % Apply 1 application topically 2 (two) times daily.   unknown at unknown   Scheduled: . imipenem-cilastatin  500 mg Intravenous Q8H  . insulin aspart  0-9 Units Subcutaneous Q4H  . pantoprazole (PROTONIX) IV  40 mg Intravenous Q24H   Continuous: . sodium chloride 100 mL/hr at 10/05/15 0504   JJO:[id number], HYDROmorphone (DILAUDID) injection, ondansetron **OR** ondansetron (ZOFRAN) IV Anti-infectives    Start     Dose/Rate Route Frequency Ordered Stop   10/05/15 0400  imipenem-cilastatin (PRIMAXIN) 500 mg in sodium chloride 0.9 % 100 mL IVPB     500 mg 200 mL/hr over 30 Minutes Intravenous Every 8 hours 10/05/15 0326        Results for orders placed or performed during the hospital encounter of 10/05/15 (from the past 48 hour(s))  Hepatic function panel     Status: Abnormal   Collection Time: 10/05/15  1:18 AM  Result Value Ref Range   Total Protein 6.3 (L) 6.5 - 8.1 g/dL   Albumin 3.4 (L) 3.5 - 5.0 g/dL   AST 222 (H) 15 - 41 U/L   ALT 283 (H) 17 - 63 U/L   Alkaline Phosphatase 95 38 - 126 U/L   Total Bilirubin 4.0 (H) 0.3 - 1.2 mg/dL   Bilirubin, Direct 2.3 (H) 0.1 - 0.5 mg/dL   Indirect Bilirubin 1.7 (H) 0.3 - 0.9 mg/dL  Protime-INR     Status: None   Collection Time: 10/05/15  1:18 AM  Result Value Ref Range   Prothrombin Time 14.0 11.6 - 15.2 seconds   INR 1.06 0.00 - 0.10  Basic metabolic panel     Status: Abnormal   Collection Time: 10/05/15  1:18 AM  Result Value Ref Range   Sodium 142 135 - 145 mmol/L   Potassium 4.1 3.5 - 5.1 mmol/L   Chloride 105 101 - 111 mmol/L   CO2 27 22 - 32 mmol/L   Glucose, Bld 86 65 - 99 mg/dL   BUN 16 6  - 20 mg/dL   Creatinine, Ser 1.24 0.61 - 1.24 mg/dL   Calcium 8.4 (L) 8.9 - 10.3 mg/dL   GFR calc non Af Amer 54 (  L) >60 mL/min   GFR calc Af Amer >60 >60 mL/min    Comment: (NOTE) The eGFR has been calculated using the CKD EPI equation. This calculation has not been validated in all clinical situations. eGFR's persistently <60 mL/min signify possible Chronic Kidney Disease.    Anion gap 10 5 - 15  CBC with Differential/Platelet     Status: Abnormal   Collection Time: 10/05/15  1:18 AM  Result Value Ref Range   WBC 8.2 4.0 - 10.5 K/uL   RBC 3.82 (L) 4.22 - 5.81 MIL/uL   Hemoglobin 11.1 (L) 13.0 - 17.0 g/dL   HCT 34.5 (L) 39.0 - 52.0 %   MCV 90.3 78.0 - 100.0 fL   MCH 29.1 26.0 - 34.0 pg   MCHC 32.2 30.0 - 36.0 g/dL   RDW 14.5 11.5 - 15.5 %   Platelets 116 (L) 150 - 400 K/uL    Comment: PLATELET COUNT CONFIRMED BY SMEAR   Neutrophils Relative % 78 %   Neutro Abs 6.4 1.7 - 7.7 K/uL   Lymphocytes Relative 11 %   Lymphs Abs 0.9 0.7 - 4.0 K/uL   Monocytes Relative 9 %   Monocytes Absolute 0.7 0.1 - 1.0 K/uL   Eosinophils Relative 3 %   Eosinophils Absolute 0.2 0.0 - 0.7 K/uL   Basophils Relative 0 %   Basophils Absolute 0.0 0.0 - 0.1 K/uL  Lipase, blood     Status: Abnormal   Collection Time: 10/05/15  1:18 AM  Result Value Ref Range   Lipase 382 (H) 11 - 51 U/L  Troponin I     Status: Abnormal   Collection Time: 10/05/15  1:18 AM  Result Value Ref Range   Troponin I 0.04 (H) <0.031 ng/mL    Comment:        PERSISTENTLY INCREASED TROPONIN VALUES IN THE RANGE OF 0.04-0.49 ng/mL CAN BE SEEN IN:       -UNSTABLE ANGINA       -CONGESTIVE HEART FAILURE       -MYOCARDITIS       -CHEST TRAUMA       -ARRYHTHMIAS       -LATE PRESENTING MYOCARDIAL INFARCTION       -COPD   CLINICAL FOLLOW-UP RECOMMENDED.   Glucose, capillary     Status: None   Collection Time: 10/05/15  5:09 AM  Result Value Ref Range   Glucose-Capillary 68 65 - 99 mg/dL  Troponin I (q 6hr x 3)     Status:  Abnormal   Collection Time: 10/05/15  5:11 AM  Result Value Ref Range   Troponin I 0.04 (H) <0.031 ng/mL    Comment:        PERSISTENTLY INCREASED TROPONIN VALUES IN THE RANGE OF 0.04-0.49 ng/mL CAN BE SEEN IN:       -UNSTABLE ANGINA       -CONGESTIVE HEART FAILURE       -MYOCARDITIS       -CHEST TRAUMA       -ARRYHTHMIAS       -LATE PRESENTING MYOCARDIAL INFARCTION       -COPD   CLINICAL FOLLOW-UP RECOMMENDED.   Glucose, capillary     Status: Abnormal   Collection Time: 10/05/15  5:39 AM  Result Value Ref Range   Glucose-Capillary 119 (H) 65 - 99 mg/dL  Glucose, capillary     Status: Abnormal   Collection Time: 10/05/15  8:06 AM  Result Value Ref Range   Glucose-Capillary 111 (H) 65 - 99  mg/dL  Troponin I (q 6hr x 3)     Status: None   Collection Time: 10/05/15  9:44 AM  Result Value Ref Range   Troponin I 0.03 <0.031 ng/mL    Comment:        NO INDICATION OF MYOCARDIAL INJURY.     Dg Chest Port 1 View  10/05/2015  CLINICAL DATA:  Shortness of Breath EXAM: PORTABLE CHEST 1 VIEW COMPARISON:  10/04/2015 FINDINGS: Cardiac shadow is stable. Postoperative changes and defibrillator are again noted and stable. Mild increased central vascular congestion is noted without pulmonary edema. No focal infiltrate is seen. No bony abnormality is noted. IMPRESSION: Increased central vascular congestion without significant pulmonary edema. Electronically Signed   By: Inez Catalina M.D.   On: 10/05/2015 07:50    Review of Systems  Constitutional: Positive for chills (sweats on Sunday).  HENT: Positive for hearing loss.   Eyes: Negative.   Respiratory: Positive for shortness of breath. Negative for cough, hemoptysis, sputum production and wheezing.   Cardiovascular: Positive for leg swelling. Negative for chest pain, palpitations, orthopnea, claudication and PND.  Gastrointestinal: Positive for nausea, vomiting and abdominal pain. Negative for diarrhea, constipation and blood in stool.  Heartburn: since electrocution 1979.  Musculoskeletal: Positive for back pain and joint pain.  Skin: Negative.   Neurological:       He has balance issues and uses 2 canes  Endo/Heme/Allergies: Bruises/bleeds easily.  Psychiatric/Behavioral: Negative.  Negative for depression.   Blood pressure 146/62, pulse 78, temperature 98 F (36.7 C), temperature source Oral, resp. rate 18, height _0  (1.676 m), weight 97.251 kg (214 lb 6.4 oz), SpO2 100 %. Physical Exam  Constitutional: He is oriented to person, place, and time. He appears well-developed and well-nourished. No distress.  Body mass index is 34.62 kg/(m^2).   HENT:  Head: Normocephalic and atraumatic.  Nose: Nose normal.  Eyes: Conjunctivae and EOM are normal. Right eye exhibits no discharge. Left eye exhibits no discharge. No scleral icterus.  Neck: Neck supple. No JVD present. No tracheal deviation present. No thyromegaly present.  Cardiovascular: Normal rate, regular rhythm, normal heart sounds and intact distal pulses.   No murmur heard. Respiratory: Effort normal and breath sounds normal. No respiratory distress. He has no wheezes. He has no rales. He exhibits no tenderness.  Midline sternotomy and left chest AICD,  This has never fired.  GI: Soft. He exhibits no distension and no mass. There is tenderness. There is no rebound and no guarding.  Musculoskeletal: He exhibits no edema.  Lymphadenopathy:    He has no cervical adenopathy.  Neurological: He is alert and oriented to person, place, and time. No cranial nerve deficit.  Skin: Skin is warm and dry. No rash noted. He is not diaphoretic. No erythema. No pallor.  Psychiatric: He has a normal mood and affect. His behavior is normal. Judgment and thought content normal.    Assessment/Plan: Gallstone pancreatitis Choledocholithiasis Body mass index is 34.6 Spinal stenosis with chronic back pain CAD/s/pCAGG and valve replacement/Hx of CHF AICD  placement AODM Electrocution 1979 Hx of nephrolithiasis Hx of Anemia Limited DNR   Plan:  We will follow with you.  He needs resolution of his pancreatitis, ERCP, and clearance for surgery for his cardiac issues.  He still has generalized abdominal tenderness, but he is most tender mid upper abdomen.      Verdella Laidlaw 10/05/2015, 11:34 AM

## 2015-10-06 ENCOUNTER — Inpatient Hospital Stay (HOSPITAL_COMMUNITY): Payer: Medicare HMO | Admitting: Critical Care Medicine

## 2015-10-06 ENCOUNTER — Encounter (HOSPITAL_COMMUNITY)
Admission: AD | Disposition: A | Discharge status: Home / Self Care | Payer: Self-pay | Source: Other Acute Inpatient Hospital | Attending: Internal Medicine

## 2015-10-06 ENCOUNTER — Encounter (HOSPITAL_COMMUNITY): Payer: Self-pay | Admitting: Physician Assistant

## 2015-10-06 ENCOUNTER — Inpatient Hospital Stay (HOSPITAL_COMMUNITY): Payer: Medicare HMO

## 2015-10-06 DIAGNOSIS — I255 Ischemic cardiomyopathy: Secondary | ICD-10-CM

## 2015-10-06 HISTORY — PX: ERCP: SHX5425

## 2015-10-06 LAB — CBC
HEMATOCRIT: 34.7 % — AB (ref 39.0–52.0)
HEMOGLOBIN: 11.5 g/dL — AB (ref 13.0–17.0)
MCH: 30.1 pg (ref 26.0–34.0)
MCHC: 33.1 g/dL (ref 30.0–36.0)
MCV: 90.8 fL (ref 78.0–100.0)
Platelets: 125 10*3/uL — ABNORMAL LOW (ref 150–400)
RBC: 3.82 MIL/uL — ABNORMAL LOW (ref 4.22–5.81)
RDW: 14.2 % (ref 11.5–15.5)
WBC: 7.8 10*3/uL (ref 4.0–10.5)

## 2015-10-06 LAB — COMPREHENSIVE METABOLIC PANEL
ALBUMIN: 3.3 g/dL — AB (ref 3.5–5.0)
ALT: 156 U/L — ABNORMAL HIGH (ref 17–63)
ANION GAP: 9 (ref 5–15)
AST: 67 U/L — AB (ref 15–41)
Alkaline Phosphatase: 102 U/L (ref 38–126)
BUN: 10 mg/dL (ref 6–20)
CHLORIDE: 105 mmol/L (ref 101–111)
CO2: 25 mmol/L (ref 22–32)
Calcium: 8.7 mg/dL — ABNORMAL LOW (ref 8.9–10.3)
Creatinine, Ser: 0.96 mg/dL (ref 0.61–1.24)
GFR calc Af Amer: >60 mL/min (ref >60)
GFR calc non Af Amer: >60 mL/min (ref >60)
GLUCOSE: 153 mg/dL — AB (ref 65–99)
POTASSIUM: 3.7 mmol/L (ref 3.5–5.1)
SODIUM: 139 mmol/L (ref 135–145)
Total Bilirubin: 1.2 mg/dL (ref 0.3–1.2)
Total Protein: 6.6 g/dL (ref 6.5–8.1)

## 2015-10-06 LAB — GLUCOSE, CAPILLARY
GLUCOSE-CAPILLARY: 111 mg/dL — AB (ref 65–99)
GLUCOSE-CAPILLARY: 123 mg/dL — AB (ref 65–99)
GLUCOSE-CAPILLARY: 129 mg/dL — AB (ref 65–99)
GLUCOSE-CAPILLARY: 154 mg/dL — AB (ref 65–99)
GLUCOSE-CAPILLARY: 193 mg/dL — AB (ref 65–99)
GLUCOSE-CAPILLARY: 235 mg/dL — AB (ref 65–99)
Glucose-Capillary: 129 mg/dL — ABNORMAL HIGH (ref 65–99)

## 2015-10-06 LAB — LIPASE, BLOOD: LIPASE: 93 U/L — AB (ref 11–51)

## 2015-10-06 SURGERY — ERCP, WITH INTERVENTION IF INDICATED
Anesthesia: General

## 2015-10-06 SURGERY — ERCP, WITH INTERVENTION IF INDICATED
Anesthesia: Monitor Anesthesia Care

## 2015-10-06 MED ORDER — ASPIRIN EC 81 MG PO TBEC
81.0000 mg | DELAYED_RELEASE_TABLET | Freq: Every day | ORAL | Status: DC
  Administered 2015-10-06 – 2015-10-09 (×4): 81 mg via ORAL
  Filled 2015-10-06 (×4): qty 1

## 2015-10-06 MED ORDER — INDOMETHACIN 50 MG RE SUPP
100.0000 mg | Freq: Once | RECTAL | Status: AC
  Administered 2015-10-06: 100 mg via RECTAL

## 2015-10-06 MED ORDER — SODIUM CHLORIDE 0.9 % IV SOLN: INTRAVENOUS | Status: DC

## 2015-10-06 MED ORDER — ONDANSETRON HCL 4 MG/2ML IJ SOLN
INTRAMUSCULAR | Status: DC | PRN
  Administered 2015-10-06: 4 mg via INTRAVENOUS

## 2015-10-06 MED ORDER — BETHANECHOL CHLORIDE 5 MG PO TABS
5.0000 mg | ORAL_TABLET | Freq: Two times a day (BID) | ORAL | Status: DC
  Administered 2015-10-06 – 2015-10-09 (×6): 5 mg via ORAL
  Filled 2015-10-06 (×8): qty 1

## 2015-10-06 MED ORDER — FENTANYL CITRATE (PF) 100 MCG/2ML IJ SOLN
INTRAMUSCULAR | Status: DC | PRN
  Administered 2015-10-06: 100 ug via INTRAVENOUS

## 2015-10-06 MED ORDER — ETOMIDATE 2 MG/ML IV SOLN
INTRAVENOUS | Status: DC | PRN
  Administered 2015-10-06: 10 mg via INTRAVENOUS

## 2015-10-06 MED ORDER — EPHEDRINE SULFATE 50 MG/ML IJ SOLN
INTRAMUSCULAR | Status: DC | PRN
  Administered 2015-10-06 (×2): 10 mg via INTRAVENOUS

## 2015-10-06 MED ORDER — LABETALOL HCL 5 MG/ML IV SOLN
5.0000 mg | Freq: Once | INTRAVENOUS | Status: AC
  Administered 2015-10-06: 5 mg via INTRAVENOUS

## 2015-10-06 MED ORDER — SUCCINYLCHOLINE CHLORIDE 20 MG/ML IJ SOLN
INTRAMUSCULAR | Status: DC | PRN
  Administered 2015-10-06: 100 mg via INTRAVENOUS

## 2015-10-06 MED ORDER — LISINOPRIL 20 MG PO TABS
20.0000 mg | ORAL_TABLET | Freq: Every day | ORAL | Status: DC
  Administered 2015-10-06 – 2015-10-09 (×4): 20 mg via ORAL
  Filled 2015-10-06 (×4): qty 1

## 2015-10-06 MED ORDER — LABETALOL HCL 5 MG/ML IV SOLN
INTRAVENOUS | Status: AC
  Filled 2015-10-06: qty 4

## 2015-10-06 MED ORDER — TAMSULOSIN HCL 0.4 MG PO CAPS
0.4000 mg | ORAL_CAPSULE | Freq: Every day | ORAL | Status: DC
  Administered 2015-10-06 – 2015-10-09 (×4): 0.4 mg via ORAL
  Filled 2015-10-06 (×4): qty 1

## 2015-10-06 MED ORDER — INDOMETHACIN 50 MG RE SUPP
RECTAL | Status: AC
  Filled 2015-10-06: qty 1

## 2015-10-06 MED ORDER — GLIMEPIRIDE 4 MG PO TABS
4.0000 mg | ORAL_TABLET | Freq: Every day | ORAL | Status: DC
  Administered 2015-10-07 – 2015-10-09 (×3): 4 mg via ORAL
  Filled 2015-10-06 (×3): qty 1

## 2015-10-06 MED ORDER — LACTATED RINGERS IV SOLN
INTRAVENOUS | Status: DC | PRN
  Administered 2015-10-06: 14:00:00 via INTRAVENOUS

## 2015-10-06 MED ORDER — PHENYLEPHRINE HCL 10 MG/ML IJ SOLN
10.0000 mg | INTRAVENOUS | Status: DC | PRN
  Administered 2015-10-06: 25 ug/min via INTRAVENOUS

## 2015-10-06 MED ORDER — FUROSEMIDE 40 MG PO TABS
60.0000 mg | ORAL_TABLET | Freq: Two times a day (BID) | ORAL | Status: DC
  Administered 2015-10-06 – 2015-10-09 (×6): 60 mg via ORAL
  Filled 2015-10-06 (×6): qty 1

## 2015-10-06 MED ORDER — LIDOCAINE HCL (CARDIAC) 20 MG/ML IV SOLN
INTRAVENOUS | Status: DC | PRN
  Administered 2015-10-06: 100 mg via INTRAVENOUS

## 2015-10-06 MED ORDER — DULOXETINE HCL 60 MG PO CPEP
60.0000 mg | ORAL_CAPSULE | Freq: Every day | ORAL | Status: DC
  Administered 2015-10-06 – 2015-10-09 (×4): 60 mg via ORAL
  Filled 2015-10-06 (×4): qty 1

## 2015-10-06 MED ORDER — HEPARIN SODIUM (PORCINE) 5000 UNIT/ML IJ SOLN
5000.0000 [IU] | Freq: Three times a day (TID) | INTRAMUSCULAR | Status: DC
  Administered 2015-10-06 – 2015-10-09 (×7): 5000 [IU] via SUBCUTANEOUS
  Filled 2015-10-06 (×6): qty 1

## 2015-10-06 MED ORDER — SODIUM CHLORIDE 0.9 % IV SOLN
INTRAVENOUS | Status: DC | PRN
  Administered 2015-10-06: 30 mL

## 2015-10-06 MED ORDER — AMLODIPINE BESYLATE 5 MG PO TABS
5.0000 mg | ORAL_TABLET | Freq: Every day | ORAL | Status: DC
  Administered 2015-10-06 – 2015-10-09 (×4): 5 mg via ORAL
  Filled 2015-10-06 (×4): qty 1

## 2015-10-06 NOTE — Interval H&P Note (Signed)
History and Physical Interval Note:  10/06/2015 1:36 PM  Roger Curry  has presented today for surgery, with the diagnosis of pancreatitis, CBD stone  The various methods of treatment have been discussed with the patient and family. After consideration of risks, benefits and other options for treatment, the patient has consented to  Procedure(s): ENDOSCOPIC RETROGRADE CHOLANGIOPANCREATOGRAPHY (ERCP) (N/A) as a surgical intervention .  The patient's history has been reviewed, patient examined, no change in status, stable for surgery.  I have reviewed the patient's chart and labs.  Questions were answered to the patient's satisfaction.     Rachael Fee

## 2015-10-06 NOTE — Transfer of Care (Signed)
Immediate Anesthesia Transfer of Care Note  Patient: Roger Curry  Procedure(s) Performed: Procedure(s): ENDOSCOPIC RETROGRADE CHOLANGIOPANCREATOGRAPHY (ERCP) (N/A)  Patient Location: Endoscopy Unit  Anesthesia Type:General  Level of Consciousness: awake, alert  and oriented  Airway & Oxygen Therapy: Patient Spontanous Breathing and Patient connected to nasal cannula oxygen  Post-op Assessment: Report given to RN, Post -op Vital signs reviewed and stable and Patient moving all extremities X 4  Post vital signs: Reviewed and stable  Last Vitals:  Filed Vitals:   10/06/15 1301 10/06/15 1459  BP: 197/83 185/74  Pulse: 90 94  Temp:    Resp: 16 16    Complications: No apparent anesthesia complications

## 2015-10-06 NOTE — H&P (View-Only) (Signed)
                                                                           [facility] Consult: 11:13 AM 10/05/2015  LOS: 0 days    Referring Provider: Dr Merrell  Primary Care Physician:  No PCP Per Patient Primary Gastroenterologist:  Unassigned.       Reason for Consultation:  choledocholithiais and biliary pancreatitis   HPI: Roger Curry is a 81 y.o. male.  PMH of MI, CAD: s/p angioplasty (he denies stent placement) and then 4 vsl CABG.  S/p valve replacement .  CHF.  S/p biventricular ICD. Type 2 DM.  S/p lumbar spine surgeries x 4 2009.  Post laminectomy syndrome.  Electrocution injury.  Kidney stones.  Bladder outlet issues, requiring self catheterization 4 x per day.  Glaucoma s/p metal implants into both eyes.  He can not undergo MRI due to metal in place.  S/p EGD by [place] MD  ~ 2014: no ulcer.  On PPI at home.  No previous colonoscopy.  Pt is not on Xarelto, in contradiction to medical notes .  This is confirmed by a call to his PMD office.   Sunday night onset of watery dairrhea, no blood or melena, "all night long".  uper abdominal pain became intense, diffuse and radiated to his back and chest.  Inhibited breathing.  Little nausea.  + dizzy with position change.  Monday seen by PMD  and sent to [facility] ED where lipase 8495.  T bili 4.  AST/ALT 631/471.  Alk phos 111.  WBC 16 K.    These are all declining this AM.   Renal function wnl.  Pain is fairly well controlled with Morphine.  CT scan with 4 mm CBD stone, cholecystitis, peri-pancreatic haziness Platelets 116, wbc 8.2.  Hgb 11  Pt does not drink ETOH   Past Medical History  Diagnosis Date  . Spinal stenosis     lumbar and cervical.   . CAD (coronary artery disease)   . Electrocution   . Type 2 diabetes mellitus (HCC)   . Bladder dysfunction     self  caths 4 x daily.   . Closed angle glaucoma   .  Renal stones   . CHF (congestive heart failure) (HCC)     EF not known.     Past Surgical History  Procedure Laterality Date  . Lumbar spine surgery  1997, 2009    1997 l spine fusion.  2009 lumbar laminectomy, discectomy, fusion.  says he has had total of 4 surgeries.  . Coronary artery bypass graft  1996  . Valve replacement  2014    unknown type but biologic, not on blood thinner  . Insertion of icd  2014  . Balloon angioplasty, artery  1992  . Glaucoma surgery    . Esophagogastroduodenoscopy  2014    in [place]  . Cystoscopy      Prior to Admission medications   Not on File  Reviewed,  Unable to import list into my note. xarelto not on list.    Scheduled Meds: . imipenem-cilastatin  500 mg Intravenous Q8H  . insulin aspart  0-9 Units Subcutaneous Q4H  . pantoprazole (  PROTONIX) IV  40 mg Intravenous Q24H   Continuous Infusions: . sodium chloride 100 mL/hr at 10/05/15 0504   PRN Meds:.hydrALAZINE, HYDROmorphone (DILAUDID) injection, ondansetron **OR** ondansetron (ZOFRAN) IV       Allergies as of 10/04/2015 - never reviewed  Allergen Reaction Noted  . Penicillins  10/04/2015  . Quinine derivatives  10/04/2015    Family History  Problem Relation Age of Onset  . Stroke Brother   . Hypertension Brother     Social History   Social History  . Marital Status: Divorced    Spouse Name: N/A  . Number of Children: N/A  . Years of Education: N/A   Occupational History  . Not on file.   Social History Main Topics  . Smoking status: Former Research scientist (life sciences)  . Smokeless tobacco: Not on file  . Alcohol Use: No  . Drug Use: No  . Sexual Activity: Not on file   Other Topics Concern  . Not on file   Social History Narrative    REVIEW OF SYSTEMS: Constitutional:  Weight stable ENT:  No nose bleeds.  Dry mouth.   Pulm:  Per HPI CV:  No palpitations, no LE edema.  GU:  No hematuria, no frequency GI:  Per HPI.  Normally no signif issues with pyrosis, dysphagia,  diarrhea, constipation Heme:  No unusual bleeding or bruising   Transfusions:  None per his recall Neuro:  No headaches, no peripheral tingling or numbness Derm:  No itching, no rash or sores.  Endocrine:  No sweats or chills.  No polyuria or dysuria Immunization:  Not queried Travel:  None beyond local counties in last few months.    PHYSICAL EXAM: Vital signs in last 24 hours: Filed Vitals:   10/05/15 0026 10/05/15 0500  BP: 147/62 154/60  Pulse: 76 80  Temp: 97.5 F (36.4 C) 98 F (36.7 C)  Resp: 18 18   Wt Readings from Last 3 Encounters:  10/05/15 97.251 kg (214 lb 6.4 oz)    General: uncomfortable, looks ill,  Head:  No swelling or asymmetry  Eyes:  No icterus or pallor Ears:  Somewhat HOH  Nose:  No discharge Mouth:  dry Neck:  No jvd or mass  No tmg Lungs:  Crackles in left base.  Heart: RRR.  No mrg.  S1/s2 audible Abdomen:  Soft, distended, diffusely tender, BS hypoactive.  .   Rectal: deferred   Musc/Skeltl: no joint swelling or deformity Extremities:  No LE edema  Neurologic:  Oriented x 3.  Tremor in 4 limbs.  Moves lll limbs, strength not tested Skin:  Sebaceous cysts on his back Tattoos:  None seen Nodes:  No cervical adenopathy   Psych:  Cooperative , pleasant.   Intake/Output from previous day:   Intake/Output this shift: Total I/O In: -  Out: 700 [Urine:700]  LAB RESULTS:  Recent Labs  10/05/15 0118  WBC 8.2  HGB 11.1*  HCT 34.5*  PLT 116*   BMET Lab Results  Component Value Date   NA 142 10/05/2015   NA 140 06/03/2008   K 4.1 10/05/2015   K 4.6 06/03/2008   CL 105 10/05/2015   CL 105 06/03/2008   CO2 27 10/05/2015   CO2 27 06/03/2008   GLUCOSE 86 10/05/2015   GLUCOSE 85 06/03/2008   BUN 16 10/05/2015   BUN 18 06/03/2008   CREATININE 1.24 10/05/2015   CREATININE 1.24 06/03/2008   CALCIUM 8.4* 10/05/2015   CALCIUM 9.7 06/03/2008   LFT  Recent Labs  10/05/15 0118  PROT 6.3*  ALBUMIN 3.4*  AST 222*  ALT 283*    ALKPHOS 95  BILITOT 4.0*  BILIDIR 2.3*  IBILI 1.7*   PT/INR Lab Results  Component Value Date   INR 1.06 10/05/2015   INR 1.0 06/03/2008   Hepatitis Panel No results for input(s): HEPBSAG, HCVAB, HEPAIGM, HEPBIGM in the last 72 hours. C-Diff No components found for: CDIFF Lipase     Component Value Date/Time   LIPASE 382* 10/05/2015 0118    Drugs of Abuse  No results found for: LABOPIA, COCAINSCRNUR, LABBENZ, AMPHETMU, THCU, LABBARB   RADIOLOGY STUDIES: Dg Chest Port 1 View  10/05/2015  CLINICAL DATA:  Shortness of Breath EXAM: PORTABLE CHEST 1 VIEW COMPARISON:  10/04/2015 FINDINGS: Cardiac shadow is stable. Postoperative changes and defibrillator are again noted and stable. Mild increased central vascular congestion is noted without pulmonary edema. No focal infiltrate is seen. No bony abnormality is noted. IMPRESSION: Increased central vascular congestion without significant pulmonary edema. Electronically Signed   By: Inez Catalina M.D.   On: 10/05/2015 07:50    ENDOSCOPIC STUDIES: Per HPI  IMPRESSION:   *  Acute biliary pancreatitis and choledocholithiasis.  Labs improved within 24 hours.    *  Thrombocytopenia.  *  Hx CHF and unknown ef.  S/p heart valve and ICD.  No details on the location of valve but as not on thinner presume it is biologic.   cxr shows early congestion.     *  Mild, stable Troponin elevation to 0.04 x 2.      PLAN:     *  ERCP with MAC Wednesday in early afternoon  *  Ice chips. .   *  ECHO ordered.    *  Trend lipase and cmet.  *  primaxin continues.  Add daily PPI.   *  May need repeat cxr if increased resp compromise  [facility] md needs to call general surgery consult.    Azucena Freed  10/05/2015, 11:13 AM Pager: [phone]

## 2015-10-06 NOTE — Anesthesia Procedure Notes (Signed)
Procedure Name: Intubation Date/Time: 10/06/2015 1:48 PM Performed by: Glo Herring B Pre-anesthesia Checklist: Patient identified, Timeout performed, Emergency Drugs available, Suction available and Patient being monitored Patient Re-evaluated:Patient Re-evaluated prior to inductionOxygen Delivery Method: Circle system utilized Preoxygenation: Pre-oxygenation with 100% oxygen Intubation Type: IV induction Ventilation: Mask ventilation without difficulty Grade View: Grade I Tube type: Oral Tube size: 7.5 mm Number of attempts: 1 Airway Equipment and Method: Stylet (Mcgrath) Placement Confirmation: CO2 detector,  positive ETCO2,  ETT inserted through vocal cords under direct vision and breath sounds checked- equal and bilateral Secured at: 23 cm Tube secured with: Tape Dental Injury: Teeth and Oropharynx as per pre-operative assessment  Comments: Pt had limited mouth opening and limited neck mobility.  McGrath 3 used for intubation.

## 2015-10-06 NOTE — Progress Notes (Signed)
Patient complained of nausea and stomach tightness and pain. RN administered patient  Zofran. Patient also requested to have a foley catheter placed because he does want to keep self catheterizing. RN notified on call NP. NP placed order in for foley catheter. RN implemented order. Upon reassessment,  patient verbalized relief of stomach tightness and pain and nausea subsided. RN will continue to monitor patient.  Veatrice Kells, RN

## 2015-10-06 NOTE — Anesthesia Preprocedure Evaluation (Addendum)
Anesthesia Evaluation  Patient identified by MRN, date of birth, ID band Patient awake    Reviewed: Allergy & Precautions, NPO status , Patient's Chart, lab work & pertinent test results  History of Anesthesia Complications Negative for: history of anesthetic complications  Airway Mallampati: II  TM Distance: >3 FB Neck ROM: Full    Dental no notable dental hx. (+) Dental Advisory Given, Chipped, Poor Dentition, Missing   Pulmonary former smoker,    Pulmonary exam normal breath sounds clear to auscultation       Cardiovascular hypertension, Pt. on medications + CAD and +CHF  Normal cardiovascular exam Rhythm:Regular Rate:Normal     Neuro/Psych negative neurological ROS  negative psych ROS   GI/Hepatic negative GI ROS, Neg liver ROS,   Endo/Other  diabetes, Type 2  Renal/GU negative Renal ROS  negative genitourinary   Musculoskeletal negative musculoskeletal ROS (+)   Abdominal   Peds negative pediatric ROS (+)  Hematology negative hematology ROS (+) anemia ,   Anesthesia Other Findings   Reproductive/Obstetrics negative OB ROS                           Anesthesia Physical Anesthesia Plan  ASA: III  Anesthesia Plan: General   Post-op Pain Management:    Induction: Intravenous  Airway Management Planned: Oral ETT  Additional Equipment:   Intra-op Plan:   Post-operative Plan: Extubation in OR  Informed Consent: I have reviewed the patients History and Physical, chart, labs and discussed the procedure including the risks, benefits and alternatives for the proposed anesthesia with the patient or authorized representative who has indicated his/her understanding and acceptance.   Dental advisory given  Plan Discussed with: Anesthesiologist and Surgeon  Anesthesia Plan Comments:         Anesthesia Quick Evaluation

## 2015-10-06 NOTE — Progress Notes (Signed)
  Subjective: Pt says he is still having pain, but seems somewhat better.  For ERCP today.  Echo:  35-40%.  Aortic valve: Trileaflet; normal thickness, mildly calcified leaflets.  Left atrium: The atrium was severely dilated.  Objective: Vital signs in last 24 hours: Temp:  [98 F (36.7 C)-98.6 F (37 C)] 98 F (36.7 C) (02/22 0836) Pulse Rate:  [78-88] 88 (02/22 0836) Resp:  [18-22] 18 (02/22 0836) BP: (146-162)/(62-81) 161/81 mmHg (02/22 0836) SpO2:  [93 %-100 %] 94 % (02/22 0836) Weight:  [97.523 kg (215 lb)] 97.523 kg (215 lb) (02/21 2010) Last BM Date: 10/04/15 NPO, 2000 urine yesterday Afebrile, VSS Glucose stable, no other labs Intake/Output from previous day: 02/21 0701 - 02/22 0700 In: 300 [IV Piggyback:300] Out: 2000 [Urine:2000] Intake/Output this shift:    General appearance: alert, cooperative and no distress GI: soft still tender to palpation.  Lab Results:   Recent Labs  10/05/15 0118  WBC 8.2  HGB 11.1*  HCT 34.5*  PLT 116*    BMET  Recent Labs  10/05/15 0118  NA 142  K 4.1  CL 105  CO2 27  GLUCOSE 86  BUN 16  CREATININE 1.24  CALCIUM 8.4*   PT/INR  Recent Labs  10/05/15 0118  LABPROT 14.0  INR 1.06     Recent Labs Lab 10/05/15 0118  AST 222*  ALT 283*  ALKPHOS 95  BILITOT 4.0*  PROT 6.3*  ALBUMIN 3.4*     Lipase     Component Value Date/Time   LIPASE 382* 10/05/2015 0118     Studies/Results: Dg Chest Port 1 View  10/05/2015  CLINICAL DATA:  Shortness of Breath EXAM: PORTABLE CHEST 1 VIEW COMPARISON:  10/04/2015 FINDINGS: Cardiac shadow is stable. Postoperative changes and defibrillator are again noted and stable. Mild increased central vascular congestion is noted without pulmonary edema. No focal infiltrate is seen. No bony abnormality is noted. IMPRESSION: Increased central vascular congestion without significant pulmonary edema. Electronically Signed   By: Alcide Clever M.D.   On: 10/05/2015 07:50     Medications: . imipenem-cilastatin  500 mg Intravenous Q8H  . insulin aspart  0-9 Units Subcutaneous Q4H  . pantoprazole (PROTONIX) IV  40 mg Intravenous Q24H    Assessment/Plan Gallstone pancreatitis Choledocholithiasis Body mass index is 34.6 Spinal stenosis with chronic back pain CAD/s/pCAGG and valve replacement/Hx of CHF AICD placement AODM Electrocution 1979 Hx of nephrolithiasis Hx of Anemia Limited DNR Antibiotics:  Day 2.5 imipenem-cilastatin  DVT:  Not on Lovenox or heparin/SCD only right now.  Plan:  For ERCP today, I will recheck labs in AM, he needs cardiac clearance from Medicine/Cardiology before any surgery.    LOS: 1 day    Julie Paolini 10/06/2015

## 2015-10-06 NOTE — Op Note (Signed)
[facility] [address] [place], [address]   ERCP PROCEDURE REPORT  PATIENT: Roger Curry, Roger Curry  MR#: [id number] BIRTHDATE: 04/24/1935  GENDER: male ENDOSCOPIST: Rachael Fee, MD REFERRED BY: Harlin Rain, MD PROCEDURE DATE:  10/06/2015 PROCEDURE:   ERCP with sphincterotomy/papillotomy INDICATIONS:biliary pancreatitis, TB max 4.0, CT scan at [facility] read as showing a 4mm CBD stone, + gallstones.  MEDICATIONS: Per Anesthesia and indomethacin  PR, primaxin IV TOPICAL ANESTHETIC:   none  DESCRIPTION OF PROCEDURE:     Physical exam was performed.  Informed consent was obtained from the patient after explaining the benefits, risks, and alternatives to the procedure.  The patient was connected to the monitor and placed in the semi-prone position. IV medicine was administered through an indwelling cannula and oxygen via endotracheal tube.  After administration of sedation, the patients esophagus was intubated and the Pentax ERCP [id number] endoscope was advanced under direct visualization to the second portion of the duodenum without detailed examination of the UGI tract. There was a medium sized periampullary duodenal diverticulum which did not signficantly distort the biliary anatomy or approach for this procedure.  A 44 Autotome over a .035 hydrawire was used to cannulate the bile duct. An identical wire was temporarily placed into the main pancreatic duct to faciliate biliary cannulation (dye was never injected into the PD).  Cholangiogram revealed a non-dilated extrahepatic biliary tree. The cystic duct was patent and the gallbladder partially filled with dye.  There were not any obvious filling defects in the bile duct but given the "4mm CBD stone described on recent CT scan I elected to perform a small to medium sized biliary sphincterotomy and sweep the duct several times with a retrieval balloon. No stones were delivered into the  duodenum. There was no purulence.  An occlusion, completion cholangiogram showed no filling defects and the scope was then completely withdrawn from the patient and the procedure terminated.    COMPLICATIONS:    None  ENDOSCOPIC IMPRESSION: Non-dilated extrahepatic biliary tree.  Patent cystic duct.  A small to medium biliary sphincterotomy was performed and the bile duct was swept several times however no stones were delivered into the duodenum.  I suspect the 4mm stone noted on outside CT had already passed by the time of this procedure.  RECOMMENDATIONS: Gallbladder resection, timing per general surgery.   _______________________________ eSignedRachael Fee, MD 10/06/2015 2:45 PM

## 2015-10-06 NOTE — Anesthesia Postprocedure Evaluation (Signed)
Anesthesia Post Note  Patient: Roger Curry  Procedure(s) Performed: Procedure(s) (LRB): ENDOSCOPIC RETROGRADE CHOLANGIOPANCREATOGRAPHY (ERCP) (N/A)  Patient location during evaluation: PACU Anesthesia Type: General Level of consciousness: awake and alert Pain management: pain level controlled Vital Signs Assessment: post-procedure vital signs reviewed and stable Respiratory status: spontaneous breathing Cardiovascular status: blood pressure returned to baseline Anesthetic complications: no    Last Vitals:  Filed Vitals:   10/06/15 1550 10/06/15 1605  BP: 180/67 178/79  Pulse:  76  Temp:    Resp:  17    Last Pain:  Filed Vitals:   10/06/15 1612  PainSc: 0-No pain                 Kennieth Rad

## 2015-10-06 NOTE — Progress Notes (Addendum)
[facility] PROGRESS NOTE  Roger Curry WUJ:[id number] DOB: 1934-09-14 DOA: 10/05/2015 PCP: No PCP Per Patient  Brief narrative 81 year old male with history of CAD status post CABG in the 90s, history of pacemaker about 2 years back (follows with Dr. Sampson Goon in [facility]), type 2 diabetes mellitus, hypertension and hyperlipidemia, was referred to [place] ED by his PCP for  acute onset of abdominal pain. CT scan done there  showed  acute gallstone pancreatitis.  AST and ALT were elevated in the 600s and lipase of 8000. Patient was transferred to [facility]. GI and surgery consulted. could not get MRCP due to his pacemaker status. He underwent ERCP showing nondilated extrahepatic biliary tree and no stones were delivered into the duodenum.  Assessment/Plan: Acute gallstone pancreatitis ERCP done today and patient likely had passed the stone. No further abdominal pain. LFTs and lipase trending down. [place] surgery following for cholecystectomy. He will need cardiac clearance prior to surgery. On empiric Primaxin. Continue PPI.  CAD with history of CABG and ischemic cardiomyopathy  defibrillator in place. No chest pain symptoms. 2-D echo shows EF of 35-40%. No prior echo in the system. He sees Dr. Antony Contras at [facility]. Was unable to reach him today. We'll try to contact him again tomorrow and obtain records from his prior echo and other cardiac workup.   Pending upon my discussion with his cardiologist I will will decide on obtaining further cardiac clearance for surgery. Patient is euvolemic. He is not on beta blocker at home. Will verify with his cardiologist. Continue baby aspirin. Hold statin given transaminitis. Resume ACE inhibitor.   Essential hypertension Stable. Continue amlodipine and lisinopril.    type 2 diabetes mellitus Resume Amaryl. Monitor on sliding scale coverage.   Urinary retention Patient reports doing straight cath 3 times a day. He is unable to do  regular catheterization by himself since acute illness so Foley was placed in.  DVT prophylaxis: sq heparin Diet: Clear liquid  Code Status: Partial Family Communication: Daughter at bedside  Disposition Plan: Home after cholecystectomy   Consultants:  [facility]  [place] [facility]  Procedures:  ERCP  Antibiotics:  IV Primaxin  HPI/Subjective: Seen and examined. Denies further abdominal pain.  Objective: Filed Vitals:   10/06/15 1605 10/06/15 1610  BP: 178/79   Pulse: 76 73  Temp:    Resp: 17 11    Intake/Output Summary (Last 24 hours) at 10/06/15 1650 Last data filed at 10/06/15 1438  Gross per 24 hour  Intake    750 ml  Output   1300 ml  Net   -550 ml   Filed Weights   10/05/15 0026 10/05/15 2010  Weight: 97.251 kg (214 lb 6.4 oz) 97.523 kg (215 lb)    Exam:   General:  Elderly male not in distress  HEENT: No pallor, moist mucosa  Chest: Clear bilaterally  CVS: Normal S1 and S2, no murmurs  GI: Soft, nondistended, nontender, Foley in place  Musculoskeletal: Warm, no edema   CNS: Alert and oriented    Data Reviewed: Basic Metabolic Panel:  Recent Labs Lab 10/05/15 0118 10/06/15 0938  NA 142 139  K 4.1 3.7  CL 105 105  CO2 27 25  GLUCOSE 86 153*  BUN 16 10  CREATININE 1.24 0.96  CALCIUM 8.4* 8.7*   Liver Function Tests:  Recent Labs Lab 10/05/15 0118 10/06/15 0938  AST 222* 67*  ALT 283* 156*  ALKPHOS 95 102  BILITOT 4.0* 1.2  PROT 6.3* 6.6  ALBUMIN 3.4* 3.3*    Recent Labs Lab 10/05/15 0118 10/06/15 0938  LIPASE 382* 93*   No results for input(s): AMMONIA in the last 168 hours. CBC:  Recent Labs Lab 10/05/15 0118 10/06/15 0938  WBC 8.2 7.8  NEUTROABS 6.4  --   HGB 11.1* 11.5*  HCT 34.5* 34.7*  MCV 90.3 90.8  PLT 116* 125*   Cardiac Enzymes:  Recent Labs Lab 10/05/15 0118 10/05/15 0511 10/05/15 0944 10/05/15 1551  TROPONINI 0.04* 0.04* 0.03 0.03   BNP (last 3 results) No results for  input(s): BNP in the last 8760 hours.  ProBNP (last 3 results) No results for input(s): PROBNP in the last 8760 hours.  CBG:  Recent Labs Lab 10/06/15 0001 10/06/15 0411 10/06/15 0833 10/06/15 1151 10/06/15 1513  GLUCAP 123* 129* 129* 193* 111*    No results found for this or any previous visit (from the past 240 hour(s)).   Studies: Dg Chest Port 1 View  10/05/2015  CLINICAL DATA:  Shortness of Breath EXAM: PORTABLE CHEST 1 VIEW COMPARISON:  10/04/2015 FINDINGS: Cardiac shadow is stable. Postoperative changes and defibrillator are again noted and stable. Mild increased central vascular congestion is noted without pulmonary edema. No focal infiltrate is seen. No bony abnormality is noted. IMPRESSION: Increased central vascular congestion without significant pulmonary edema. Electronically Signed   By: Alcide Clever M.D.   On: 10/05/2015 07:50   Dg Ercp Biliary & Pancreatic Ducts  10/06/2015  CLINICAL DATA:  Biliary dilatation, concern for choledocholithiasis. EXAM: ERCP CONTRAST:  Per endoscopy report FLUOROSCOPY TIME:  Radiation Exposure Index (as provided by the fluoroscopic device): Not available If the device does not provide the exposure index: Fluoroscopy Time (in minutes and seconds):  3 minutes 12 second Number of Acquired Images:  5 COMPARISON:  10/04/2015 FINDINGS: Guidewires advanced into the pancreatic duct and common bile duct. Contrast injection performed. Contrast refluxes into the gallbladder. Cholelithiasis evident. Balloon sweep performed of the extrahepatic duct. No biliary dilatation or obstruction appreciated by the limited imaging. IMPRESSION: Cholelithiasis.  Patent biliary system.  Negative for obstruction. Electronically Signed   By: Judie Petit.  Shick M.D.   On: 10/06/2015 15:38    Scheduled Meds: . imipenem-cilastatin  500 mg Intravenous Q8H  . insulin aspart  0-9 Units Subcutaneous Q4H  . pantoprazole (PROTONIX) IV  40 mg Intravenous Q24H   Continuous Infusions:    Principal Problem:   Acute gallstone pancreatitis Active Problems:   Diabetes mellitus type 2, controlled (HCC)   Essential hypertension   CAD (coronary artery disease)   Normocytic normochromic anemia   Acute pancreatitis    Time spent: 25 minutes    Maram Bently  [facility] Pager [phone] If 7PM-7AM, please contact night-coverage at [email or web address], password [facility] 10/06/2015, 4:50 PM  LOS: 1 day

## 2015-10-06 NOTE — Progress Notes (Signed)
Some nausea overnight and continuing RUQ pain.  Stable vitals.  Note pt EF is  35 to 40%.  No valve issues on Echo.  After d/w pt's dtr yesterday, she corrected pt hx as never having had valve replacement, just some sort of valve repair.  Hx list was corrected  Will repeat labs this AM.    S Camellia Popescu PA-C

## 2015-10-07 ENCOUNTER — Other Ambulatory Visit: Payer: Self-pay | Admitting: General Surgery

## 2015-10-07 ENCOUNTER — Encounter (HOSPITAL_COMMUNITY): Payer: Self-pay | Admitting: Gastroenterology

## 2015-10-07 DIAGNOSIS — I10 Essential (primary) hypertension: Secondary | ICD-10-CM

## 2015-10-07 DIAGNOSIS — I5022 Chronic systolic (congestive) heart failure: Secondary | ICD-10-CM

## 2015-10-07 LAB — COMPREHENSIVE METABOLIC PANEL
ALBUMIN: 3 g/dL — AB (ref 3.5–5.0)
ALK PHOS: 87 U/L (ref 38–126)
ALT: 105 U/L — ABNORMAL HIGH (ref 17–63)
ANION GAP: 8 (ref 5–15)
AST: 42 U/L — ABNORMAL HIGH (ref 15–41)
BUN: 10 mg/dL (ref 6–20)
CALCIUM: 8.3 mg/dL — AB (ref 8.9–10.3)
CHLORIDE: 102 mmol/L (ref 101–111)
CO2: 29 mmol/L (ref 22–32)
Creatinine, Ser: 0.97 mg/dL (ref 0.61–1.24)
GFR calc non Af Amer: >60 mL/min (ref >60)
GLUCOSE: 181 mg/dL — AB (ref 65–99)
POTASSIUM: 3.2 mmol/L — AB (ref 3.5–5.1)
SODIUM: 139 mmol/L (ref 135–145)
Total Bilirubin: 0.9 mg/dL (ref 0.3–1.2)
Total Protein: 6.1 g/dL — ABNORMAL LOW (ref 6.5–8.1)

## 2015-10-07 LAB — LIPASE, BLOOD: Lipase: 85 U/L — ABNORMAL HIGH (ref 11–51)

## 2015-10-07 LAB — GLUCOSE, CAPILLARY
GLUCOSE-CAPILLARY: 130 mg/dL — AB (ref 65–99)
GLUCOSE-CAPILLARY: 166 mg/dL — AB (ref 65–99)
GLUCOSE-CAPILLARY: 180 mg/dL — AB (ref 65–99)
GLUCOSE-CAPILLARY: 202 mg/dL — AB (ref 65–99)
GLUCOSE-CAPILLARY: 215 mg/dL — AB (ref 65–99)
Glucose-Capillary: 145 mg/dL — ABNORMAL HIGH (ref 65–99)

## 2015-10-07 LAB — CBC
HCT: 32.7 % — ABNORMAL LOW (ref 39.0–52.0)
HEMOGLOBIN: 11.2 g/dL — AB (ref 13.0–17.0)
MCH: 30.8 pg (ref 26.0–34.0)
MCHC: 34.3 g/dL (ref 30.0–36.0)
MCV: 89.8 fL (ref 78.0–100.0)
Platelets: 126 10*3/uL — ABNORMAL LOW (ref 150–400)
RBC: 3.64 MIL/uL — ABNORMAL LOW (ref 4.22–5.81)
RDW: 13.9 % (ref 11.5–15.5)
WBC: 5.5 10*3/uL (ref 4.0–10.5)

## 2015-10-07 MED ORDER — PANTOPRAZOLE SODIUM 40 MG PO TBEC
40.0000 mg | DELAYED_RELEASE_TABLET | Freq: Every day | ORAL | Status: DC
  Administered 2015-10-08 – 2015-10-09 (×2): 40 mg via ORAL
  Filled 2015-10-07 (×2): qty 1

## 2015-10-07 MED ORDER — METOPROLOL TARTRATE 12.5 MG HALF TABLET
12.5000 mg | ORAL_TABLET | Freq: Two times a day (BID) | ORAL | Status: DC
  Administered 2015-10-07 – 2015-10-09 (×5): 12.5 mg via ORAL
  Filled 2015-10-07 (×5): qty 1

## 2015-10-07 MED ORDER — CIPROFLOXACIN IN D5W 400 MG/200ML IV SOLN
400.0000 mg | Freq: Once | INTRAVENOUS | Status: AC
  Administered 2015-10-08: 400 mg via INTRAVENOUS
  Filled 2015-10-07: qty 200

## 2015-10-07 MED ORDER — POTASSIUM CHLORIDE CRYS ER 20 MEQ PO TBCR
40.0000 meq | EXTENDED_RELEASE_TABLET | Freq: Once | ORAL | Status: AC
  Administered 2015-10-07: 40 meq via ORAL
  Filled 2015-10-07: qty 2

## 2015-10-07 MED ORDER — HYPROMELLOSE (GONIOSCOPIC) 2.5 % OP SOLN
1.0000 [drp] | Freq: Four times a day (QID) | OPHTHALMIC | Status: DC | PRN
  Filled 2015-10-07: qty 15

## 2015-10-07 NOTE — Progress Notes (Signed)
Daily Rounding Note  10/07/2015, 10:45 AM  LOS: 2 days   SUBJECTIVE:       Feels great, back to normal.  No belly pain, no sob, no nausea.  OBJECTIVE:         Vital signs in last 24 hours:    Temp:  [97.5 F (36.4 C)-98 F (36.7 C)] 97.9 F (36.6 C) (02/23 0807) Pulse Rate:  [71-94] 76 (02/23 0807) Resp:  [10-20] 18 (02/23 0807) BP: (125-198)/(63-83) 148/68 mmHg (02/23 0807) SpO2:  [90 %-100 %] 98 % (02/23 0807) Weight:  [97 kg (213 lb 13.5 oz)] 97 kg (213 lb 13.5 oz) (02/23 0100) Last BM Date: 10/06/15 Filed Weights   10/05/15 0026 10/05/15 2010 10/07/15 0100  Weight: 97.251 kg (214 lb 6.4 oz) 97.523 kg (215 lb) 97 kg (213 lb 13.5 oz)   General: looks well.     Heart: RRR Chest: clear bil.   Abdomen: soft, NT, ND.  Active BS  Extremities: no CCE Neuro/Psych:  Pleasant, comfortable.  No gross deficits.   Intake/Output from previous day: 02/22 0701 - 02/23 0700 In: 770 [P.O.:120; I.V.:450; IV Piggyback:200] Out: 2200 [Urine:2200]  Intake/Output this shift: Total I/O In: 800 [P.O.:800] Out: -   Lab Results:  Recent Labs  10/05/15 0118 10/06/15 0938 10/07/15 0541  WBC 8.2 7.8 5.5  HGB 11.1* 11.5* 11.2*  HCT 34.5* 34.7* 32.7*  PLT 116* 125* 126*   BMET  Recent Labs  10/05/15 0118 10/06/15 0938 10/07/15 0541  NA 142 139 139  K 4.1 3.7 3.2*  CL 105 105 102  CO2 GLUCOSE 86 153* 181*  BUN CREATININE 1.24 0.96 0.97  CALCIUM 8.4* 8.7* 8.3*   LFT  Recent Labs  10/05/15 0118 10/06/15 0938 10/07/15 0541  PROT 6.3* 6.6 6.1*  ALBUMIN 3.4* 3.3* 3.0*  AST 222* 67* 42*  ALT 283* 156* 105*  ALKPHOS 95 102 87  BILITOT 4.0* 1.2 0.9  BILIDIR 2.3*  --   --   IBILI 1.7*  --   --    PT/INR  Recent Labs  10/05/15 0118  LABPROT 14.0  INR 1.06   Hepatitis Panel No results for input(s): HEPBSAG, HCVAB, HEPAIGM, HEPBIGM in the last 72 hours.  Studies/Results: Dg Ercp  Biliary & Pancreatic Ducts  10/06/2015  CLINICAL DATA:  Biliary dilatation, concern for choledocholithiasis. EXAM: ERCP CONTRAST:  Per endoscopy report FLUOROSCOPY TIME:  Radiation Exposure Index (as provided by the fluoroscopic device): Not available If the device does not provide the exposure index: Fluoroscopy Time (in minutes and seconds):  3 minutes 12 second Number of Acquired Images:  5 COMPARISON:  10/04/2015 FINDINGS: Guidewires advanced into the pancreatic duct and common bile duct. Contrast injection performed. Contrast refluxes into the gallbladder. Cholelithiasis evident. Balloon sweep performed of the extrahepatic duct. No biliary dilatation or obstruction appreciated by the limited imaging. IMPRESSION: Cholelithiasis.  Patent biliary system.  Negative for obstruction. Electronically Signed   By: Judie Petit.  Shick M.D.   On: 10/06/2015 15:38   Scheduled Meds: . amLODipine  5 mg Oral Daily  . aspirin EC  81 mg Oral Daily  . bethanechol  5 mg Oral BID  . DULoxetine  60 mg Oral Daily  . furosemide  60 mg Oral BID  . glimepiride  4 mg Oral Q breakfast  . heparin subcutaneous  5,000 Units Subcutaneous 3 times per day  . imipenem-cilastatin  500 mg  Intravenous Q8H  . insulin aspart  0-9 Units Subcutaneous Q4H  . lisinopril  20 mg Oral Daily  . metoprolol tartrate  12.5 mg Oral BID  . pantoprazole (PROTONIX) IV  40 mg Intravenous Q24H  . tamsulosin  0.4 mg Oral Daily   Continuous Infusions:  PRN Meds:.hydrALAZINE, HYDROmorphone (DILAUDID) injection, hydroxypropyl methylcellulose / hypromellose, ondansetron **OR** ondansetron (ZOFRAN) IV   ASSESMENT:   *  Choledocholithiasis, cholelithiasis and biliary pancreatitis. No stone on Oct 21, 2022 ERCP, likely had passed.  No complications/problems post ERCP  gen surg planning lap chole 2/24.   LFTs improving.   *  Hypokalemia.  Po Potassum given.    PLAN   *  Will sign off.  Does not need GI follow up for biliary issues.    *  Since there is no  bile duct obstruction, fever or WBC elevation have  Stopped abx.        Jennye Moccasin  10/07/2015, 10:45 AM Pager: [phone]

## 2015-10-07 NOTE — Progress Notes (Addendum)
[facility] PROGRESS NOTE  ZIA KANNER VHQ:[id number] DOB: Jan 30, 1935 DOA: 10/05/2015 PCP: No PCP Per Patient  Brief narrative 81 year old male with history of CAD status post CABG in the 90s, history of pacemaker about 2 years back (follows with Dr. Sampson Goon in [facility]), type 2 diabetes mellitus, hypertension and hyperlipidemia, was referred to [place] ED by his PCP for acute onset of abdominal pain. CT scan done there showed acute gallstone pancreatitis. AST and ALT were elevated in the 600s and lipase of 8000. Patient was transferred to [facility]. GI and surgery consulted. could not get MRCP due to his pacemaker status. He underwent ERCP showing nondilated extrahepatic biliary tree and no stones were delivered into the duodenum.  Assessment/Plan: Acute gallstone pancreatitis ERCP done  and patient likely had passed the stone. No further abdominal pain. LFTs and lipase trending down. [place] surgery following. Laparoscopy cholecystectomy on 2/24. On empiric Primaxin, switched to ciprofloxacin. Continue PPI.  CAD with history of CABG and ischemic cardiomyopathy Patient does not have any anginal symptoms and is euvolemic on exam. Reports that on good day he can walk almost about a mile. 2-D echo shows EF of 35-40%.  He sees Dr. Antony Contras at [facility] my spoke with on the phone this morning. Informs me that patient had an echo in the last 18 months with EF around 35% and as per my description to him about the echo results done here, it seemed to have unchanged. He had a cardiac cath in 2015 with native CAD with all grafts patent. Does not need further preoperative cardiac evaluation. - has a Paediatric nurse and CRT-D/ Bi-ventricular defibrillator. Was last checked on 05/26/2016. Have consulted EP and they will interrogate the device. -Patient is not on beta blocker, Dr. Sampson Goon informed that it was likely because of low heart rate in the past and now that he has a Medtronic he  was okay with adding low-dose beta blocker. I started patient on low-dose metoprolol. Continue baby aspirin. Hold statin given transaminitis. Continue ACE inhibitor.   Essential hypertension Stable. Continue amlodipine and lisinopril. Added low dose metoprolol perioperatively.   type 2 diabetes mellitus Resumed Amaryl. Monitor on sliding scale coverage.   Urinary retention Patient reports doing straight cath 3 times a day. He is unable to do regular catheterization by himself since acute illness so Foley was placed in.  DVT prophylaxis: sq heparin  Diet: Diabetic, nothing by mouth after midnight  Code Status: Partial (DO NOT INTUBATE) Family Communication: None at bedside today Disposition Plan: Home after laparoscopic cholecystectomy. Possibly on 2/25-2/26   Consultants:  GI   surgery  Procedures:  CT abdomen and pelvis (done at outside hospital)  ERCP  Antibiotics:  Imipenem 2/21-2/23  cipro 2/23--  HPI/Subjective: Seen and examined. Denies any further abdominal pain.  Objective: Filed Vitals:   10/07/15 0431 10/07/15 0807  BP: 125/63 148/68  Pulse: 73 76  Temp: 97.8 F (36.6 C) 97.9 F (36.6 C)  Resp: 20 18    Intake/Output Summary (Last 24 hours) at 10/07/15 1250 Last data filed at 10/07/15 0900  Gross per 24 hour  Intake   1570 ml  Output   2200 ml  Net   -630 ml   Filed Weights   10/05/15 0026 10/05/15 2010 10/07/15 0100  Weight: 97.251 kg (214 lb 6.4 oz) 97.523 kg (215 lb) 97 kg (213 lb 13.5 oz)    Exam:   General: Elderly male not in distress  HEENT: No pallor, moist mucosa  Chest:  Clear bilaterally  CVS: Normal S1 and S2, no murmurs  GI: Soft, nondistended, nontender, Foley in place  Musculoskeletal: Warm, no edema    Data Reviewed: Basic Metabolic Panel:  Recent Labs Lab 10/05/15 0118 10/06/15 0938 10/07/15 0541  NA 142 139 139  K 4.1 3.7 3.2*  CL 105 105 102  CO2 GLUCOSE 86 153* 181*  BUN  CREATININE 1.24 0.96 0.97  CALCIUM 8.4* 8.7* 8.3*   Liver Function Tests:  Recent Labs Lab 10/05/15 0118 10/06/15 0938 10/07/15 0541  AST 222* 67* 42*  ALT 283* 156* 105*  ALKPHOS 95 102 87  BILITOT 4.0* 1.2 0.9  PROT 6.3* 6.6 6.1*  ALBUMIN 3.4* 3.3* 3.0*    Recent Labs Lab 10/05/15 0118 10/06/15 0938 10/07/15 0541  LIPASE 382* 93* 85*   No results for input(s): AMMONIA in the last 168 hours. CBC:  Recent Labs Lab 10/05/15 0118 10/06/15 0938 10/07/15 0541  WBC 8.2 7.8 5.5  NEUTROABS 6.4  --   --   HGB 11.1* 11.5* 11.2*  HCT 34.5* 34.7* 32.7*  MCV 90.3 90.8 89.8  PLT 116* 125* 126*   Cardiac Enzymes:  Recent Labs Lab 10/05/15 0118 10/05/15 0511 10/05/15 0944 10/05/15 1551  TROPONINI 0.04* 0.04* 0.03 0.03   BNP (last 3 results) No results for input(s): BNP in the last 8760 hours.  ProBNP (last 3 results) No results for input(s): PROBNP in the last 8760 hours.  CBG:  Recent Labs Lab 10/06/15 1949 10/07/15 0002 10/07/15 0429 10/07/15 0810 10/07/15 1114  GLUCAP 235* 202* 180* 145* 215*    No results found for this or any previous visit (from the past 240 hour(s)).   Studies: Dg Ercp Biliary & Pancreatic Ducts  10/06/2015  CLINICAL DATA:  Biliary dilatation, concern for choledocholithiasis. EXAM: ERCP CONTRAST:  Per endoscopy report FLUOROSCOPY TIME:  Radiation Exposure Index (as provided by the fluoroscopic device): Not available If the device does not provide the exposure index: Fluoroscopy Time (in minutes and seconds):  3 minutes 12 second Number of Acquired Images:  5 COMPARISON:  10/04/2015 FINDINGS: Guidewires advanced into the pancreatic duct and common bile duct. Contrast injection performed. Contrast refluxes into the gallbladder. Cholelithiasis evident. Balloon sweep performed of the extrahepatic duct. No biliary dilatation or obstruction appreciated by the limited imaging. IMPRESSION: Cholelithiasis.  Patent biliary system.   Negative for obstruction. Electronically Signed   By: Judie Petit.  Shick M.D.   On: 10/06/2015 15:38    Scheduled Meds: . amLODipine  5 mg Oral Daily  . aspirin EC  81 mg Oral Daily  . bethanechol  5 mg Oral BID  . [START ON 10/08/2015] ciprofloxacin  400 mg Intravenous Once  . DULoxetine  60 mg Oral Daily  . furosemide  60 mg Oral BID  . glimepiride  4 mg Oral Q breakfast  . heparin subcutaneous  5,000 Units Subcutaneous 3 times per day  . insulin aspart  0-9 Units Subcutaneous Q4H  . lisinopril  20 mg Oral Daily  . metoprolol tartrate  12.5 mg Oral BID  . pantoprazole  40 mg Oral Q0600  . tamsulosin  0.4 mg Oral Daily   Continuous Infusions:     Time spent: 25 minutes    Eddie North  [facility] Pager [phone] If 7PM-7AM, please contact night-coverage at [email or web address], password [facility] 10/07/2015, 12:50 PM  LOS: 2 days

## 2015-10-07 NOTE — Progress Notes (Signed)
1 Day Post-Op  Subjective: No pain this Am, tolerating clears.  Feeling much better.  Objective: Vital signs in last 24 hours: Temp:  [97.5 F (36.4 C)-98 F (36.7 C)] 97.9 F (36.6 C) (02/23 0807) Pulse Rate:  [71-94] 76 (02/23 0807) Resp:  [10-20] 18 (02/23 0807) BP: (125-198)/(63-83) 148/68 mmHg (02/23 0807) SpO2:  [90 %-100 %] 98 % (02/23 0807) Weight:  [97 kg (213 lb 13.5 oz)] 97 kg (213 lb 13.5 oz) (02/23 0100) Last BM Date: 10/06/15 120 PO  2200 urine No Bm Afebrile, VSS Labs are better Lipase is 85 Intake/Output from previous day: 02/22 0701 - 02/23 0700 In: 770 [P.O.:120; I.V.:450; IV Piggyback:200] Out: 2200 [Urine:2200] Intake/Output this shift:    General appearance: alert, cooperative and no distress GI: soft, non-tender; bowel sounds normal; no masses,  no organomegaly  Lab Results:   Recent Labs  10/06/15 0938 10/07/15 0541  WBC 7.8 5.5  HGB 11.5* 11.2*  HCT 34.7* 32.7*  PLT 125* 126*    BMET  Recent Labs  10/06/15 0938 10/07/15 0541  NA 139 139  K 3.7 3.2*  CL 105 102  CO2 25 29  GLUCOSE 153* 181*  BUN 10 10  CREATININE 0.96 0.97  CALCIUM 8.7* 8.3*   PT/INR  Recent Labs  10/05/15 0118  LABPROT 14.0  INR 1.06     Recent Labs Lab 10/05/15 0118 10/06/15 0938 10/07/15 0541  AST 222* 67* 42*  ALT 283* 156* 105*  ALKPHOS 95 102 87  BILITOT 4.0* 1.2 0.9  PROT 6.3* 6.6 6.1*  ALBUMIN 3.4* 3.3* 3.0*     Lipase     Component Value Date/Time   LIPASE 85* 10/07/2015 0541     Studies/Results: Dg Ercp Biliary & Pancreatic Ducts  10/06/2015  CLINICAL DATA:  Biliary dilatation, concern for choledocholithiasis. EXAM: ERCP CONTRAST:  Per endoscopy report FLUOROSCOPY TIME:  Radiation Exposure Index (as provided by the fluoroscopic device): Not available If the device does not provide the exposure index: Fluoroscopy Time (in minutes and seconds):  3 minutes 12 second Number of Acquired Images:  5 COMPARISON:  10/04/2015 FINDINGS:  Guidewires advanced into the pancreatic duct and common bile duct. Contrast injection performed. Contrast refluxes into the gallbladder. Cholelithiasis evident. Balloon sweep performed of the extrahepatic duct. No biliary dilatation or obstruction appreciated by the limited imaging. IMPRESSION: Cholelithiasis.  Patent biliary system.  Negative for obstruction. Electronically Signed   By: Judie Petit.  Shick M.D.   On: 10/06/2015 15:38    Medications: . amLODipine  5 mg Oral Daily  . aspirin EC  81 mg Oral Daily  . bethanechol  5 mg Oral BID  . DULoxetine  60 mg Oral Daily  . furosemide  60 mg Oral BID  . glimepiride  4 mg Oral Q breakfast  . heparin subcutaneous  5,000 Units Subcutaneous 3 times per day  . imipenem-cilastatin  500 mg Intravenous Q8H  . insulin aspart  0-9 Units Subcutaneous Q4H  . lisinopril  20 mg Oral Daily  . metoprolol tartrate  12.5 mg Oral BID  . pantoprazole (PROTONIX) IV  40 mg Intravenous Q24H  . tamsulosin  0.4 mg Oral Daily    Assessment/Plan Gallstone pancreatitis Choledocholithiasis S/p ERCP with sphincterotomy/papillotomy, (no stone found) 10/06/15, Dr. Wendall Papa  Body mass index is 34.6 Spinal stenosis with chronic back pain CAD/s/pCAGG and valve replacement/Hx of CHF AICD placement AODM Electrocution 1979 Hx of nephrolithiasis Hx of Anemia Limited DNR Antibiotics: Day 3.5 imipenem-cilastatin  DVT: Not on  Lovenox or heparin/SCD only right now.    Plan:   DR. Gonzella Lex has discussed with his cardiologist, Echo is unchanged he is asymptomatic and it is his opinion he can undergo surgery.  I will get him ready for surgery tomorrow.  His daughter is in the room and we discussed the risk and benefits of the surgery.  They are agreeable to this.  i would keep him on clears and continue antibiotics as ordered.    LOS: 2 days    Roger Curry 10/07/2015

## 2015-10-08 ENCOUNTER — Inpatient Hospital Stay (HOSPITAL_COMMUNITY): Payer: Medicare HMO | Admitting: Anesthesiology

## 2015-10-08 ENCOUNTER — Encounter (HOSPITAL_COMMUNITY)
Admission: AD | Disposition: A | Discharge status: Home / Self Care | Payer: Self-pay | Source: Other Acute Inpatient Hospital | Attending: Internal Medicine

## 2015-10-08 ENCOUNTER — Encounter (HOSPITAL_COMMUNITY): Payer: Self-pay | Admitting: *Deleted

## 2015-10-08 HISTORY — PX: CHOLECYSTECTOMY: SHX55

## 2015-10-08 LAB — COMPREHENSIVE METABOLIC PANEL
ALK PHOS: 84 U/L (ref 38–126)
ALT: 82 U/L — AB (ref 17–63)
AST: 36 U/L (ref 15–41)
Albumin: 3.2 g/dL — ABNORMAL LOW (ref 3.5–5.0)
Anion gap: 9 (ref 5–15)
BUN: 9 mg/dL (ref 6–20)
CALCIUM: 8.4 mg/dL — AB (ref 8.9–10.3)
CHLORIDE: 101 mmol/L (ref 101–111)
CO2: 29 mmol/L (ref 22–32)
CREATININE: 1.07 mg/dL (ref 0.61–1.24)
GFR calc Af Amer: >60 mL/min (ref >60)
Glucose, Bld: 124 mg/dL — ABNORMAL HIGH (ref 65–99)
Potassium: 3.4 mmol/L — ABNORMAL LOW (ref 3.5–5.1)
Sodium: 139 mmol/L (ref 135–145)
Total Bilirubin: 1 mg/dL (ref 0.3–1.2)
Total Protein: 6.3 g/dL — ABNORMAL LOW (ref 6.5–8.1)

## 2015-10-08 LAB — CBC
HCT: 33.4 % — ABNORMAL LOW (ref 39.0–52.0)
HEMOGLOBIN: 11.4 g/dL — AB (ref 13.0–17.0)
MCH: 30.5 pg (ref 26.0–34.0)
MCHC: 34.1 g/dL (ref 30.0–36.0)
MCV: 89.3 fL (ref 78.0–100.0)
PLATELETS: 125 10*3/uL — AB (ref 150–400)
RBC: 3.74 MIL/uL — AB (ref 4.22–5.81)
RDW: 14 % (ref 11.5–15.5)
WBC: 5.9 10*3/uL (ref 4.0–10.5)

## 2015-10-08 LAB — GLUCOSE, CAPILLARY
GLUCOSE-CAPILLARY: 115 mg/dL — AB (ref 65–99)
GLUCOSE-CAPILLARY: 154 mg/dL — AB (ref 65–99)
GLUCOSE-CAPILLARY: 180 mg/dL — AB (ref 65–99)
GLUCOSE-CAPILLARY: 249 mg/dL — AB (ref 65–99)
Glucose-Capillary: 157 mg/dL — ABNORMAL HIGH (ref 65–99)
Glucose-Capillary: 160 mg/dL — ABNORMAL HIGH (ref 65–99)
Glucose-Capillary: 202 mg/dL — ABNORMAL HIGH (ref 65–99)
Glucose-Capillary: 84 mg/dL (ref 65–99)

## 2015-10-08 LAB — SURGICAL PCR SCREEN
MRSA, PCR: NEGATIVE
STAPHYLOCOCCUS AUREUS: NEGATIVE

## 2015-10-08 LAB — LIPASE, BLOOD: LIPASE: 104 U/L — AB (ref 11–51)

## 2015-10-08 SURGERY — LAPAROSCOPIC CHOLECYSTECTOMY WITH INTRAOPERATIVE CHOLANGIOGRAM
Anesthesia: General | Site: Abdomen

## 2015-10-08 MED ORDER — OXYCODONE HCL 5 MG PO TABS: 5.0000 mg | ORAL_TABLET | ORAL | Status: DC | PRN

## 2015-10-08 MED ORDER — SODIUM CHLORIDE 0.9 % IR SOLN
Status: DC | PRN
  Administered 2015-10-08: 1000 mL

## 2015-10-08 MED ORDER — LIDOCAINE HCL (CARDIAC) 20 MG/ML IV SOLN
INTRAVENOUS | Status: DC | PRN
  Administered 2015-10-08: 80 mg via INTRAVENOUS

## 2015-10-08 MED ORDER — GLYCOPYRROLATE 0.2 MG/ML IJ SOLN
INTRAMUSCULAR | Status: DC | PRN
  Administered 2015-10-08: 0.6 mg via INTRAVENOUS

## 2015-10-08 MED ORDER — LIDOCAINE HCL (CARDIAC) 20 MG/ML IV SOLN
INTRAVENOUS | Status: AC
  Filled 2015-10-08: qty 10

## 2015-10-08 MED ORDER — BUPIVACAINE-EPINEPHRINE (PF) 0.25% -1:200000 IJ SOLN
INTRAMUSCULAR | Status: AC
  Filled 2015-10-08: qty 30

## 2015-10-08 MED ORDER — PROPOFOL 10 MG/ML IV BOLUS
INTRAVENOUS | Status: AC
  Filled 2015-10-08: qty 20

## 2015-10-08 MED ORDER — PROPOFOL 10 MG/ML IV BOLUS
INTRAVENOUS | Status: DC | PRN
  Administered 2015-10-08: 80 mg via INTRAVENOUS

## 2015-10-08 MED ORDER — FENTANYL CITRATE (PF) 250 MCG/5ML IJ SOLN
INTRAMUSCULAR | Status: AC
  Filled 2015-10-08: qty 5

## 2015-10-08 MED ORDER — NEOSTIGMINE METHYLSULFATE 10 MG/10ML IV SOLN
INTRAVENOUS | Status: DC | PRN
  Administered 2015-10-08: 4 mg via INTRAVENOUS

## 2015-10-08 MED ORDER — BUPIVACAINE-EPINEPHRINE 0.25% -1:200000 IJ SOLN
INTRAMUSCULAR | Status: DC | PRN
Start: 2015-10-08 — End: 2015-10-08
  Administered 2015-10-08: 18 mL

## 2015-10-08 MED ORDER — PROMETHAZINE HCL 25 MG/ML IJ SOLN: 6.2500 mg | INTRAMUSCULAR | Status: DC | PRN

## 2015-10-08 MED ORDER — PHENYLEPHRINE HCL 10 MG/ML IJ SOLN
10.0000 mg | INTRAVENOUS | Status: DC | PRN
  Administered 2015-10-08: 25 ug/min via INTRAVENOUS

## 2015-10-08 MED ORDER — ROCURONIUM BROMIDE 50 MG/5ML IV SOLN
INTRAVENOUS | Status: AC
  Filled 2015-10-08: qty 2

## 2015-10-08 MED ORDER — LACTATED RINGERS IV SOLN
INTRAVENOUS | Status: DC
  Administered 2015-10-08: 10:00:00 via INTRAVENOUS

## 2015-10-08 MED ORDER — HYDROMORPHONE HCL 1 MG/ML IJ SOLN: 0.2500 mg | INTRAMUSCULAR | Status: DC | PRN

## 2015-10-08 MED ORDER — SUCCINYLCHOLINE CHLORIDE 20 MG/ML IJ SOLN
INTRAMUSCULAR | Status: AC
  Filled 2015-10-08: qty 1

## 2015-10-08 MED ORDER — ALBUTEROL SULFATE HFA 108 (90 BASE) MCG/ACT IN AERS
INHALATION_SPRAY | RESPIRATORY_TRACT | Status: AC
  Filled 2015-10-08: qty 6.7

## 2015-10-08 MED ORDER — ONDANSETRON HCL 4 MG/2ML IJ SOLN
INTRAMUSCULAR | Status: DC | PRN
  Administered 2015-10-08: 4 mg via INTRAVENOUS

## 2015-10-08 MED ORDER — SCOPOLAMINE 1 MG/3DAYS TD PT72: 1.0000 | MEDICATED_PATCH | TRANSDERMAL | Status: DC

## 2015-10-08 MED ORDER — SODIUM CHLORIDE 0.9 % IJ SOLN
INTRAMUSCULAR | Status: AC
  Filled 2015-10-08: qty 10

## 2015-10-08 MED ORDER — FENTANYL CITRATE (PF) 100 MCG/2ML IJ SOLN
INTRAMUSCULAR | Status: DC | PRN
  Administered 2015-10-08 (×2): 50 ug via INTRAVENOUS
  Administered 2015-10-08: 100 ug via INTRAVENOUS

## 2015-10-08 MED ORDER — SUCCINYLCHOLINE CHLORIDE 20 MG/ML IJ SOLN
INTRAMUSCULAR | Status: DC | PRN
  Administered 2015-10-08: 100 mg via INTRAVENOUS

## 2015-10-08 MED ORDER — ROCURONIUM BROMIDE 100 MG/10ML IV SOLN
INTRAVENOUS | Status: DC | PRN
  Administered 2015-10-08: 10 mg via INTRAVENOUS
  Administered 2015-10-08: 20 mg via INTRAVENOUS

## 2015-10-08 MED ORDER — EPHEDRINE SULFATE 50 MG/ML IJ SOLN
INTRAMUSCULAR | Status: AC
  Filled 2015-10-08: qty 1

## 2015-10-08 MED ORDER — 0.9 % SODIUM CHLORIDE (POUR BTL) OPTIME
TOPICAL | Status: DC | PRN
  Administered 2015-10-08: 1000 mL

## 2015-10-08 SURGICAL SUPPLY — 52 items
APPLIER CLIP 5 13 M/L LIGAMAX5 (MISCELLANEOUS) ×3
BLADE SURG ROTATE 9660 (MISCELLANEOUS) ×3 IMPLANT
CANISTER SUCTION 2500CC (MISCELLANEOUS) ×3 IMPLANT
CHLORAPREP W/TINT 26ML (MISCELLANEOUS) ×3 IMPLANT
CLIP APPLIE 5 13 M/L LIGAMAX5 (MISCELLANEOUS) ×1 IMPLANT
COVER MAYO STAND STRL (DRAPES) IMPLANT
COVER SURGICAL LIGHT HANDLE (MISCELLANEOUS) ×3 IMPLANT
DRAPE C-ARM 42X72 X-RAY (DRAPES) IMPLANT
ELECT REM PT RETURN 9FT ADLT (ELECTROSURGICAL) ×3
ELECTRODE REM PT RTRN 9FT ADLT (ELECTROSURGICAL) ×1 IMPLANT
FILTER SMOKE EVAC LAPAROSHD (FILTER) IMPLANT
GLOVE BIO SURGEON STRL SZ8 (GLOVE) ×3 IMPLANT
GLOVE BIOGEL PI IND STRL 6.5 (GLOVE) ×1 IMPLANT
GLOVE BIOGEL PI IND STRL 7.0 (GLOVE) ×2 IMPLANT
GLOVE BIOGEL PI IND STRL 7.5 (GLOVE) ×1 IMPLANT
GLOVE BIOGEL PI IND STRL 8 (GLOVE) ×1 IMPLANT
GLOVE BIOGEL PI INDICATOR 6.5 (GLOVE) ×2
GLOVE BIOGEL PI INDICATOR 7.0 (GLOVE) ×4
GLOVE BIOGEL PI INDICATOR 7.5 (GLOVE) ×2
GLOVE BIOGEL PI INDICATOR 8 (GLOVE) ×2
GLOVE ECLIPSE 6.5 STRL STRAW (GLOVE) ×3 IMPLANT
GLOVE ECLIPSE 7.0 STRL STRAW (GLOVE) ×3 IMPLANT
GLOVE SURG SS PI 6.5 STRL IVOR (GLOVE) ×3 IMPLANT
GLOVE SURG SS PI 7.5 STRL IVOR (GLOVE) ×3 IMPLANT
GOWN STRL REUS W/ TWL LRG LVL3 (GOWN DISPOSABLE) ×4 IMPLANT
GOWN STRL REUS W/ TWL XL LVL3 (GOWN DISPOSABLE) ×1 IMPLANT
GOWN STRL REUS W/TWL LRG LVL3 (GOWN DISPOSABLE) ×8
GOWN STRL REUS W/TWL XL LVL3 (GOWN DISPOSABLE) ×2
KIT BASIN OR (CUSTOM PROCEDURE TRAY) ×3 IMPLANT
KIT ROOM TURNOVER OR (KITS) ×3 IMPLANT
L-HOOK LAP DISP 36CM (ELECTROSURGICAL) ×3
LHOOK LAP DISP 36CM (ELECTROSURGICAL) ×1 IMPLANT
LIQUID BAND (GAUZE/BANDAGES/DRESSINGS) ×3 IMPLANT
NEEDLE 22X1 1/2 (OR ONLY) (NEEDLE) ×3 IMPLANT
NS IRRIG 1000ML POUR BTL (IV SOLUTION) ×3 IMPLANT
PAD ARMBOARD 7.5X6 YLW CONV (MISCELLANEOUS) ×3 IMPLANT
PENCIL BUTTON HOLSTER BLD 10FT (ELECTRODE) ×3 IMPLANT
POUCH RETRIEVAL ECOSAC 10 (ENDOMECHANICALS) ×1 IMPLANT
POUCH RETRIEVAL ECOSAC 10MM (ENDOMECHANICALS) ×2
SCISSORS LAP 5X35 DISP (ENDOMECHANICALS) ×3 IMPLANT
SET CHOLANGIOGRAPH 5 50 .035 (SET/KITS/TRAYS/PACK) IMPLANT
SET IRRIG TUBING LAPAROSCOPIC (IRRIGATION / IRRIGATOR) ×3 IMPLANT
SLEEVE ENDOPATH XCEL 5M (ENDOMECHANICALS) ×6 IMPLANT
SPECIMEN JAR SMALL (MISCELLANEOUS) ×3 IMPLANT
SUT VIC AB 4-0 PS2 27 (SUTURE) ×3 IMPLANT
SUT VICRYL 0 UR6 27IN ABS (SUTURE) ×3 IMPLANT
TOWEL OR 17X24 6PK STRL BLUE (TOWEL DISPOSABLE) IMPLANT
TOWEL OR 17X26 10 PK STRL BLUE (TOWEL DISPOSABLE) ×3 IMPLANT
TRAY LAPAROSCOPIC MC (CUSTOM PROCEDURE TRAY) ×3 IMPLANT
TROCAR XCEL BLUNT TIP 100MML (ENDOMECHANICALS) ×3 IMPLANT
TROCAR XCEL NON-BLD 5MMX100MML (ENDOMECHANICALS) ×3 IMPLANT
TUBING INSUFFLATION (TUBING) ×3 IMPLANT

## 2015-10-08 NOTE — Transfer of Care (Signed)
Immediate Anesthesia Transfer of Care Note  Patient: Roger Curry  Procedure(s) Performed: Procedure(s): LAPAROSCOPIC CHOLECYSTECTOMY (N/A)  Patient Location: PACU  Anesthesia Type:General  Level of Consciousness: awake, alert  and oriented  Airway & Oxygen Therapy: Patient Spontanous Breathing and Patient connected to face mask oxygen  Post-op Assessment: Report given to RN, Post -op Vital signs reviewed and stable and Patient moving all extremities X 4  Post vital signs: Reviewed and stable  Last Vitals:  Filed Vitals:   10/08/15 0428 10/08/15 0800  BP: 142/75 135/53  Pulse: 71 73  Temp: 36.7 C 36.4 C  Resp: 17 18    Complications: No apparent anesthesia complications

## 2015-10-08 NOTE — Progress Notes (Signed)
Pt AICD covered with magnet during OR. Stated "does not need interrogation" by CRNA on report.

## 2015-10-08 NOTE — Anesthesia Postprocedure Evaluation (Signed)
Anesthesia Post Note  Patient: Roger Curry  Procedure(s) Performed: Procedure(s) (LRB): LAPAROSCOPIC CHOLECYSTECTOMY (N/A)  Patient location during evaluation: PACU Anesthesia Type: General Level of consciousness: sedated Pain management: pain level controlled Vital Signs Assessment: post-procedure vital signs reviewed and stable Respiratory status: spontaneous breathing and respiratory function stable Cardiovascular status: stable Anesthetic complications: no    Last Vitals:  Filed Vitals:   10/08/15 1145 10/08/15 1200  BP: 149/58 139/44  Pulse: 58 54  Temp:  36.9 C  Resp: 7 12    Last Pain:  Filed Vitals:   10/08/15 1203  PainSc: 0-No pain                 Tyvon Eggenberger DANIEL

## 2015-10-08 NOTE — Op Note (Signed)
10/05/2015 - 10/08/2015  11:23 AM  PATIENT:  Roger Curry  81 y.o. male  PRE-OPERATIVE DIAGNOSIS:  BILIARY PANCREATITIS  POST-OPERATIVE DIAGNOSIS:  BILIARY PANCREATITIS  PROCEDURE:  Procedure(s): LAPAROSCOPIC CHOLECYSTECTOMY  SURGEON:  Surgeon(s): Violeta Gelinas, MD  ASSISTANTS: none   ANESTHESIA:   local and general  EBL:  Total I/O In: 700 [I.V.:700] Out: 175 [Urine:150; Blood:25]  BLOOD ADMINISTERED:none  DRAINS: none   SPECIMEN:  Excision  DISPOSITION OF SPECIMEN:  PATHOLOGY  COUNTS:  YES  DICTATION: .Dragon Dictation Findings: No significant acute cholecystitis  Procedure in detail: Itzael presents for cholecystectomy. His biliary pancreatitis has improved. He was attended by the prep holding area. Informed consent was obtained. He received intravenous antibiotics. He was brought to the operating room and general endotracheal anesthesia was managed by the anesthesia staff. His abdomen was prepped and draped in a sterile fashion. We did a timeout procedure. The infraumbilical region was infiltrated with local. Infraumbilical incision was made. Subcutaneous tissues were dissected down around his umbilical hernia. The umbilical hernia sac was entered. It contained only omentum. This was dissected free off of the umbilical skin. The omentum was freed up from his back into the abdomen. A 0 Vicryl pressure was placed around the fascial opening. Trocar Was Inserted and the Abdomen Was Insufflated in Standard Fashion. Under Direct Vision, 5 Mm Epigastric and 5 Mm Right Lateral Port 2 Were Placed. Local Was Used at [employer]. The Dominant Gallbladder Was Retracted Superiorly Medially. The Infundibulum Was Then Retracted Inferolaterally. Dissection Laterally and Progress Medially Easily Identified the Cystic Duct. Dissection Continued until We Had a a Critical View between the Cystic Duct, the Infundibulum, and the Liver. Once we had excellent visualization, 3 clips were placed  proximally on the cystic duct, one was placed distally and it was divided. Further dissection revealed both an anterior and posterior branch of the cystic artery. These were each clipped twice proximally and divided. The gallbladder was removed from the liver bed with cautery and placed in a bag. It was removed from the abdomen via the umbilical port site. Liver was irrigated. It was dry. The clips remain in good position. Ports were then removed under direct vision. Pneumoperitoneum was released. Umbilical fascial defect was then closed with interrupted 0 Vicryls and then the pursestring was tied. All 4 wounds were copiously irrigated and the skin was closed with running 4-0 Vicryl subcuticular followed by Dermabond. All counts were correct. He tolerated the procedure well without apparent consultation was taken recovery in stable condition.  PATIENT DISPOSITION:  PACU - hemodynamically stable.   Delay start of Pharmacological VTE agent (>24hrs) due to surgical blood loss or risk of bleeding:  no  Violeta Gelinas, MD, MPH, FACS Pager: [phone]  2/24/201711:23 AM

## 2015-10-08 NOTE — Progress Notes (Signed)
[facility] PROGRESS NOTE  Roger Curry WUJ:[id number] DOB: 1935-03-25 DOA: 10/05/2015 PCP: No PCP Per Patient  Brief narrative 81 year old male with history of CAD status post CABG in the 90s, history of pacemaker about 2 years back (follows with Dr. Sampson Goon in [facility]), type 2 diabetes mellitus, hypertension and hyperlipidemia, was referred to [place] ED by his PCP for acute onset of abdominal pain. CT scan done there showed acute gallstone pancreatitis. AST and ALT were elevated in the 600s and lipase of 8000. Patient was transferred to [facility]. GI and surgery consulted. could not get MRCP due to his pacemaker status. He underwent ERCP showing nondilated extrahepatic biliary tree and no stones were delivered into the duodenum.  Assessment/Plan: Acute gallstone pancreatitis ERCP done  and patient likely had passed the stone. No further abdominal pain. LFTs and lipase trending down. Chilili surgery following. Laparoscopy cholecystectomy this morning. On empiric Primaxin, switched to ciprofloxacin. Continue PPI.  CAD with history of CABG and ischemic cardiomyopathy Patient does not have any anginal symptoms and is euvolemic on exam. Reports that on good day he can walk almost about a mile. 2-D echo shows EF of 35-40%.  He sees Dr. Antony Contras at [facility] whom I spoke with on the phone on 2/23.Marland Kitchen According to him, patient had an echo in the last 18 months with EF around 35% and as per my description to him about the echo results done here, it seemed to have unchanged. He had a cardiac cath in 2015 with native CAD with all grafts patent. Does not need further preoperative cardiac evaluation. - has a Paediatric nurse and CRT-D/ Bi-ventricular defibrillator. Was last checked on 05/26/2016.  consulted EP and they will interrogate the device. -Added low-dose perioperative beta blocker. Continue baby aspirin. Hold statin given transaminitis. Continue ACE inhibitor.   Essential  hypertension Stable. Continue amlodipine and lisinopril. Added low dose metoprolol perioperatively.   type 2 diabetes mellitus Resumed Amaryl. Monitor on sliding scale coverage.   Urinary retention Does self straight cath at home. Placed on Foley catheter while in the hospital.  DVT prophylaxis: sq heparin  Diet: Diabetic, nothing by mouth after midnight  Code Status: Partial (DO NOT INTUBATE) Family Communication: son at bedside Disposition Plan: Home after laparoscopic cholecystectomy. Possibly on 2/25-2/26   Consultants:  GI  [place] surgery  Procedures:  CT abdomen and pelvis (done at outside hospital)  ERCP  Antibiotics:  Imipenem 2/21-2/23  cipro 2/23--  HPI/Subjective: Seen and examined. No Overnight issues  Objective: Filed Vitals:   10/08/15 0428 10/08/15 0800  BP: 142/75 135/53  Pulse: 71 73  Temp: 98 F (36.7 C) 97.5 F (36.4 C)  Resp: 17 18    Intake/Output Summary (Last 24 hours) at 10/08/15 1003 Last data filed at 10/08/15 0600  Gross per 24 hour  Intake    480 ml  Output   3200 ml  Net  -2720 ml   Filed Weights   10/05/15 2010 10/07/15 0100 10/08/15 0428  Weight: 97.523 kg (215 lb) 97 kg (213 lb 13.5 oz) 98.8 kg (217 lb 13 oz)    Exam:   General: Elderly male not in distress  HEENT: No pallor, moist mucosa  Chest: Clear bilaterally  CVS: Normal S1 and S2, no murmurs  GI: Soft, nondistended, nontender, Foley in place  Musculoskeletal: Warm, no edema    Data Reviewed: Basic Metabolic Panel:  Recent Labs Lab 10/05/15 0118 10/06/15 0938 10/07/15 0541 10/08/15 0550  NA 142 139 139 139  K  4.1 3.7 3.2* 3.4*  CL 105 105 102 101  CO2 GLUCOSE 86 153* 181* 124*  BUN CREATININE 1.24 0.96 0.97 1.07  CALCIUM 8.4* 8.7* 8.3* 8.4*   Liver Function Tests:  Recent Labs Lab 10/05/15 0118 10/06/15 0938 10/07/15 0541 10/08/15 0550  AST 222* 67* 42* 36  ALT 283* 156* 105* 82*  ALKPHOS 95  102 87 84  BILITOT 4.0* 1.2 0.9 1.0  PROT 6.3* 6.6 6.1* 6.3*  ALBUMIN 3.4* 3.3* 3.0* 3.2*    Recent Labs Lab 10/05/15 0118 10/06/15 0938 10/07/15 0541 10/08/15 0550  LIPASE 382* 93* 85* 104*   No results for input(s): AMMONIA in the last 168 hours. CBC:  Recent Labs Lab 10/05/15 0118 10/06/15 0938 10/07/15 0541 10/08/15 0550  WBC 8.2 7.8 5.5 5.9  NEUTROABS 6.4  --   --   --   HGB 11.1* 11.5* 11.2* 11.4*  HCT 34.5* 34.7* 32.7* 33.4*  MCV 90.3 90.8 89.8 89.3  PLT 116* 125* 126* 125*   Cardiac Enzymes:  Recent Labs Lab 10/05/15 0118 10/05/15 0511 10/05/15 0944 10/05/15 1551  TROPONINI 0.04* 0.04* 0.03 0.03   BNP (last 3 results) No results for input(s): BNP in the last 8760 hours.  ProBNP (last 3 results) No results for input(s): PROBNP in the last 8760 hours.  CBG:  Recent Labs Lab 10/07/15 2050 10/08/15 0018 10/08/15 0426 10/08/15 0751 10/08/15 0933  GLUCAP 130* 84 115* 157* 154*    Recent Results (from the past 240 hour(s))  Surgical pcr screen     Status: None   Collection Time: 10/08/15  4:38 AM  Result Value Ref Range Status   MRSA, PCR NEGATIVE NEGATIVE Final   Staphylococcus aureus NEGATIVE NEGATIVE Final    Comment:        The Xpert SA Assay (FDA approved for NASAL specimens in patients over 49 years of age), is one component of a comprehensive surveillance program.  Test performance has been validated by [facility] for patients greater than or equal to 75 year old. It is not intended to diagnose infection nor to guide or monitor treatment.      Studies: Dg Ercp Biliary & Pancreatic Ducts  10/06/2015  CLINICAL DATA:  Biliary dilatation, concern for choledocholithiasis. EXAM: ERCP CONTRAST:  Per endoscopy report FLUOROSCOPY TIME:  Radiation Exposure Index (as provided by the fluoroscopic device): Not available If the device does not provide the exposure index: Fluoroscopy Time (in minutes and seconds):  3 minutes 12 second Number  of Acquired Images:  5 COMPARISON:  10/04/2015 FINDINGS: Guidewires advanced into the pancreatic duct and common bile duct. Contrast injection performed. Contrast refluxes into the gallbladder. Cholelithiasis evident. Balloon sweep performed of the extrahepatic duct. No biliary dilatation or obstruction appreciated by the limited imaging. IMPRESSION: Cholelithiasis.  Patent biliary system.  Negative for obstruction. Electronically Signed   By: Judie Petit.  Shick M.D.   On: 10/06/2015 15:38    Scheduled Meds: . [MAR Hold] amLODipine  5 mg Oral Daily  . [MAR Hold] aspirin EC  81 mg Oral Daily  . [MAR Hold] bethanechol  5 mg Oral BID  . [MAR Hold] ciprofloxacin  400 mg Intravenous Once  . [MAR Hold] DULoxetine  60 mg Oral Daily  . [MAR Hold] furosemide  60 mg Oral BID  . [MAR Hold] glimepiride  4 mg Oral Q breakfast  . [MAR Hold] heparin subcutaneous  5,000 Units Subcutaneous 3 times per day  . [  MAR Hold] insulin aspart  0-9 Units Subcutaneous Q4H  . [MAR Hold] lisinopril  20 mg Oral Daily  . [MAR Hold] metoprolol tartrate  12.5 mg Oral BID  . [MAR Hold] pantoprazole  40 mg Oral Q0600  . [MAR Hold] tamsulosin  0.4 mg Oral Daily   Continuous Infusions: . lactated ringers 10 mL/hr at 10/08/15 4098      Time spent: 25 minutes    Eddie North  [facility] Pager [phone] If 7PM-7AM, please contact night-coverage at [email or web address], password [facility] 10/08/2015, 10:03 AM  LOS: 3 days

## 2015-10-08 NOTE — Discharge Instructions (Signed)
Laparoscopic Cholecystectomy, Care After °Refer to this sheet in the next few weeks. These instructions provide you with information about caring for yourself after your procedure. Your health care provider may also give you more specific instructions. Your treatment has been planned according to current medical practices, but problems sometimes occur. Call your health care provider if you have any problems or questions after your procedure. °WHAT TO EXPECT AFTER THE PROCEDURE °After your procedure, it is common to have: °· Pain at your incision sites. You will be given pain medicines to control your pain. °· Mild nausea or vomiting. This should improve after the first 24 hours. °· Bloating and possible shoulder pain from the gas that was used during the procedure. This will improve after the first 24 hours. °HOME CARE INSTRUCTIONS °Incision Care °· Follow instructions from your health care provider about how to take care of your incisions. Make sure you: °¨ Wash your hands with soap and water before you change your bandage (dressing). If soap and water are not available, use hand sanitizer. °¨ Change your dressing as told by your health care provider. °¨ Leave stitches (sutures), skin glue, or adhesive strips in place. These skin closures may need to be in place for 2 weeks or longer. If adhesive strip edges start to loosen and curl up, you may trim the loose edges. Do not remove adhesive strips completely unless your health care provider tells you to do that. °· Do not take baths, swim, or use a hot tub until your health care provider approves. Ask your health care provider if you can take showers. You may only be allowed to take sponge baths for bathing. °General Instructions °· Take over-the-counter and prescription medicines only as told by your health care provider. °· Do not drive or operate heavy machinery while taking prescription pain medicine. °· Return to your normal diet as told by your health care  provider. °· Do not lift anything that is heavier than 10 lb (4.5 kg). °· Do not play contact sports for one week or until your health care provider approves. °SEEK MEDICAL CARE IF:  °· You have redness, swelling, or pain at the site of your incision. °· You have fluid, blood, or pus coming from your incision. °· You notice a bad smell coming from your incision area. °· Your surgical incisions break open. °· You have a fever. °SEEK IMMEDIATE MEDICAL CARE IF: °· You develop a rash. °· You have difficulty breathing. °· You have chest pain. °· You have increasing pain in your shoulders (shoulder strap areas). °· You faint or have dizzy episodes while you are standing. °· You have severe pain in your abdomen. °· You have nausea or vomiting that lasts for more than one day. °  °This information is not intended to replace advice given to you by your health care provider. Make sure you discuss any questions you have with your health care provider. °  °Document Released: 07/31/2005 Document Revised: 04/21/2015 Document Reviewed: 03/12/2013 °Elsevier Interactive Patient Education ©2016 [employer] °CCS ______[facility] °LAPAROSCOPIC SURGERY: POST OP INSTRUCTIONS °Always review your discharge instruction sheet given to you by the facility where your surgery was performed. °IF YOU HAVE DISABILITY OR FAMILY LEAVE FORMS, YOU MUST BRING THEM TO THE OFFICE FOR PROCESSING.   °DO NOT GIVE THEM TO YOUR DOCTOR. ° °1. A prescription for pain medication may be given to you upon discharge.  Take your pain medication as prescribed, if needed.  If narcotic   pain medicine is not needed, then you may take acetaminophen (Tylenol) or ibuprofen (Advil) as needed. °2. Take your usually prescribed medications unless otherwise directed. °3. If you need a refill on your pain medication, please contact your pharmacy.  They will contact our office to request authorization. Prescriptions will not be filled after 5pm or on  week-ends. °4. You should follow a light diet the first few days after arrival home, such as soup and crackers, etc.  Be sure to include lots of fluids daily. °5. Most patients will experience some swelling and bruising in the area of the incisions.  Ice packs will help.  Swelling and bruising can take several days to resolve.  °6. It is common to experience some constipation if taking pain medication after surgery.  Increasing fluid intake and taking a stool softener (such as Colace) will usually help or prevent this problem from occurring.  A mild laxative (Milk of Magnesia or Miralax) should be taken according to package instructions if there are no bowel movements after 48 hours. °7. Unless discharge instructions indicate otherwise, you may remove your bandages 24-48 hours after surgery, and you may shower at that time.  You may have steri-strips (small skin tapes) in place directly over the incision.  These strips should be left on the skin for 7-10 days.  If your surgeon used skin glue on the incision, you may shower in 24 hours.  The glue will flake off over the next 2-3 weeks.  Any sutures or staples will be removed at the office during your follow-up visit. °8. ACTIVITIES:  You may resume regular (light) daily activities beginning the next day--such as daily self-care, walking, climbing stairs--gradually increasing activities as tolerated.  You may have sexual intercourse when it is comfortable.  Refrain from any heavy lifting or straining until approved by your doctor. °a. You may drive when you are no longer taking prescription pain medication, you can comfortably wear a seatbelt, and you can safely maneuver your car and apply brakes. °b. RETURN TO WORK:  __________________________________________________________ °9. You should see your doctor in the office for a follow-up appointment approximately 2-3 weeks after your surgery.  Make sure that you call for this appointment within a day or two after you  arrive home to insure a convenient appointment time. °10. OTHER INSTRUCTIONS: __________________________________________________________________________________________________________________________ __________________________________________________________________________________________________________________________ °WHEN TO CALL YOUR DOCTOR: °1. Fever over 101.0 °2. Inability to urinate °3. Continued bleeding from incision. °4. Increased pain, redness, or drainage from the incision. °5. Increasing abdominal pain ° °The clinic staff is available to answer your questions during regular business hours.  Please don’t hesitate to call and ask to speak to one of the nurses for clinical concerns.  If you have a medical emergency, go to the nearest emergency room or call 911.  A surgeon from [facility] is always on call at the hospital. °[address], [address], [place], [place]  [address] ? [address] [address], [place], [place]   [address] °[phone] ? [phone] ? FAX [phone] °Web site: [email or web address] ° °

## 2015-10-08 NOTE — Anesthesia Procedure Notes (Signed)
Procedure Name: Intubation Date/Time: 10/08/2015 10:34 AM Performed by: Glo Herring B Pre-anesthesia Checklist: Patient identified, Timeout performed, Emergency Drugs available, Suction available and Patient being monitored Patient Re-evaluated:Patient Re-evaluated prior to inductionOxygen Delivery Method: Circle system utilized Preoxygenation: Pre-oxygenation with 100% oxygen Intubation Type: IV induction Ventilation: Mask ventilation without difficulty Grade View: Grade I Tube type: Oral Tube size: 7.5 mm Number of attempts: 1 Airway Equipment and Method: Stylet and Video-laryngoscopy Placement Confirmation: CO2 detector,  positive ETCO2,  ETT inserted through vocal cords under direct vision and breath sounds checked- equal and bilateral Secured at: 23 cm Tube secured with: Tape Dental Injury: Teeth and Oropharynx as per pre-operative assessment  Comments: Pt complains of neck pain and has limited extension - glidescope used.

## 2015-10-08 NOTE — Care Management Important Message (Signed)
Important Message  Patient Details  Name: RENNER SEBALD MRN: [id number] Date of Birth: 01/31/1935   Medicare Important Message Given:  Yes    Syler Norcia P Reginia Battie 10/08/2015, 1:12 PM

## 2015-10-08 NOTE — Anesthesia Preprocedure Evaluation (Addendum)
Anesthesia Evaluation  Patient identified by MRN, date of birth, ID band Patient awake    Reviewed: Allergy & Precautions, NPO status , Patient's Chart, lab work & pertinent test results  History of Anesthesia Complications Negative for: history of anesthetic complications  Airway Mallampati: III  TM Distance: >3 FB Neck ROM: Full    Dental  (+) Poor Dentition, Dental Advisory Given   Pulmonary former smoker,    Pulmonary exam normal        Cardiovascular hypertension, + CAD and +CHF  Normal cardiovascular exam+ dysrhythmias Atrial Fibrillation   Study Conclusions  - Left ventricle: The cavity size was moderately dilated. Systolic function was moderately reduced. The estimated ejection fraction was in the range of 35% to 40%. Moderate diffuse hypokinesis. - Aortic valve: Trileaflet; normal thickness, mildly calcified leaflets. - Left atrium: The atrium was severely dilated.    Neuro/Psych    GI/Hepatic negative GI ROS, Neg liver ROS,   Endo/Other  diabetes  Renal/GU      Musculoskeletal   Abdominal   Peds  Hematology   Anesthesia Other Findings   Reproductive/Obstetrics                            Anesthesia Physical Anesthesia Plan  ASA: III  Anesthesia Plan: General   Post-op Pain Management:    Induction: Intravenous  Airway Management Planned: Oral ETT  Additional Equipment:   Intra-op Plan:   Post-operative Plan: Extubation in OR  Informed Consent: I have reviewed the patients History and Physical, chart, labs and discussed the procedure including the risks, benefits and alternatives for the proposed anesthesia with the patient or authorized representative who has indicated his/her understanding and acceptance.   Dental advisory given  Plan Discussed with: CRNA and Anesthesiologist  Anesthesia Plan Comments:        Anesthesia Quick Evaluation

## 2015-10-08 NOTE — Progress Notes (Signed)
Patient ID: Roger Curry, male   DOB: August 03, 1935, 81 y.o.   MRN: [id number] For laparoscopic cholecystectomy. Consent obtained yesterday. I answered his questions. Violeta Gelinas, MD, MPH, FACS Trauma: [phone] General Surgery: [phone]

## 2015-10-09 DIAGNOSIS — I5022 Chronic systolic (congestive) heart failure: Secondary | ICD-10-CM | POA: Diagnosis present

## 2015-10-09 DIAGNOSIS — R740 Nonspecific elevation of levels of transaminase and lactic acid dehydrogenase [LDH]: Secondary | ICD-10-CM

## 2015-10-09 DIAGNOSIS — I2510 Atherosclerotic heart disease of native coronary artery without angina pectoris: Secondary | ICD-10-CM

## 2015-10-09 DIAGNOSIS — R7401 Elevation of levels of liver transaminase levels: Secondary | ICD-10-CM | POA: Diagnosis present

## 2015-10-09 LAB — CBC
HEMATOCRIT: 33 % — AB (ref 39.0–52.0)
HEMOGLOBIN: 10.6 g/dL — AB (ref 13.0–17.0)
MCH: 28.5 pg (ref 26.0–34.0)
MCHC: 32.1 g/dL (ref 30.0–36.0)
MCV: 88.7 fL (ref 78.0–100.0)
Platelets: 131 10*3/uL — ABNORMAL LOW (ref 150–400)
RBC: 3.72 MIL/uL — ABNORMAL LOW (ref 4.22–5.81)
RDW: 13.9 % (ref 11.5–15.5)
WBC: 6.2 10*3/uL (ref 4.0–10.5)

## 2015-10-09 LAB — COMPREHENSIVE METABOLIC PANEL
ALBUMIN: 3 g/dL — AB (ref 3.5–5.0)
ALK PHOS: 82 U/L (ref 38–126)
ALT: 65 U/L — ABNORMAL HIGH (ref 17–63)
ANION GAP: 12 (ref 5–15)
AST: 33 U/L (ref 15–41)
BILIRUBIN TOTAL: 0.9 mg/dL (ref 0.3–1.2)
BUN: 8 mg/dL (ref 6–20)
CALCIUM: 8.3 mg/dL — AB (ref 8.9–10.3)
CO2: 29 mmol/L (ref 22–32)
Chloride: 98 mmol/L — ABNORMAL LOW (ref 101–111)
Creatinine, Ser: 1 mg/dL (ref 0.61–1.24)
GLUCOSE: 151 mg/dL — AB (ref 65–99)
Potassium: 3.1 mmol/L — ABNORMAL LOW (ref 3.5–5.1)
Sodium: 139 mmol/L (ref 135–145)
TOTAL PROTEIN: 6 g/dL — AB (ref 6.5–8.1)

## 2015-10-09 LAB — GLUCOSE, CAPILLARY
GLUCOSE-CAPILLARY: 168 mg/dL — AB (ref 65–99)
GLUCOSE-CAPILLARY: 173 mg/dL — AB (ref 65–99)
Glucose-Capillary: 136 mg/dL — ABNORMAL HIGH (ref 65–99)

## 2015-10-09 MED ORDER — OXYCODONE HCL 5 MG PO TABS: 5.0000 mg | ORAL_TABLET | Freq: Four times a day (QID) | ORAL | Status: DC | PRN

## 2015-10-09 MED ORDER — POTASSIUM CHLORIDE CRYS ER 20 MEQ PO TBCR
40.0000 meq | EXTENDED_RELEASE_TABLET | Freq: Once | ORAL | Status: AC
  Administered 2015-10-09: 40 meq via ORAL
  Filled 2015-10-09: qty 2

## 2015-10-09 MED ORDER — METOPROLOL TARTRATE 25 MG PO TABS: 12.5000 mg | ORAL_TABLET | Freq: Two times a day (BID) | ORAL | Status: DC

## 2015-10-09 NOTE — Progress Notes (Signed)
1 Day Post-Op  Subjective: Doing well, sites looks good.  Objective: Vital signs in last 24 hours: Temp:  [97.5 F (36.4 C)-98.5 F (36.9 C)] 97.5 F (36.4 C) (02/25 0848) Pulse Rate:  [54-84] 84 (02/25 0848) Resp:  [7-18] 18 (02/25 0848) BP: (123-150)/(41-64) 123/60 mmHg (02/25 0848) SpO2:  [91 %-100 %] 100 % (02/25 0848) Weight:  [95.6 kg (210 lb 12.2 oz)] 95.6 kg (210 lb 12.2 oz) (02/25 0448) Last BM Date: 10/06/15 240 PO  Full liquids Urine 3150 1 Bm,  Afebrile, VSS K+ low Labs stable Intake/Output from previous day: 02/24 0701 - 02/25 0700 In: 700 [I.V.:700] Out: 3175 [Urine:3150; Blood:25] Intake/Output this shift: Total I/O In: 240 [P.O.:240] Out: 1 [Stool:1]  General appearance: alert, cooperative and no distress GI: soft, + BS, tolerating full liquids, go to cardiac diet.  Sites all look good.  Lab Results:   Recent Labs  10/08/15 0550 10/09/15 0609  WBC 5.9 6.2  HGB 11.4* 10.6*  HCT 33.4* 33.0*  PLT 125* 131*    BMET  Recent Labs  10/08/15 0550 10/09/15 0609  NA 139 139  K 3.4* 3.1*  CL 101 98*  CO2 29 29  GLUCOSE 124* 151*  BUN 9 8  CREATININE 1.07 1.00  CALCIUM 8.4* 8.3*   PT/INR No results for input(s): LABPROT, INR in the last 72 hours.   Recent Labs Lab 10/05/15 0118 10/06/15 0938 10/07/15 0541 10/08/15 0550 10/09/15 0609  AST 222* 67* 42* 36 33  ALT 283* 156* 105* 82* 65*  ALKPHOS 95 102 87 84 82  BILITOT 4.0* 1.2 0.9 1.0 0.9  PROT 6.3* 6.6 6.1* 6.3* 6.0*  ALBUMIN 3.4* 3.3* 3.0* 3.2* 3.0*     Lipase     Component Value Date/Time   LIPASE 104* 10/08/2015 0550     Studies/Results: No results found.  Medications: . amLODipine  5 mg Oral Daily  . aspirin EC  81 mg Oral Daily  . bethanechol  5 mg Oral BID  . DULoxetine  60 mg Oral Daily  . furosemide  60 mg Oral BID  . glimepiride  4 mg Oral Q breakfast  . heparin subcutaneous  5,000 Units Subcutaneous 3 times per day  . insulin aspart  0-9 Units Subcutaneous  Q4H  . lisinopril  20 mg Oral Daily  . metoprolol tartrate  12.5 mg Oral BID  . pantoprazole  40 mg Oral Q0600  . tamsulosin  0.4 mg Oral Daily    Assessment/Plan Gallstone pancreatitis Choledocholithiasis S/p ERCP with sphincterotomy/papillotomy, (no stone found) 10/06/15, Dr. Wendall Papa  Laparoscopic cholecystectomy 10/08/15, Dr. Janee Morn Body mass index is 34.6 Spinal stenosis with chronic back pain CAD/s/pCAGG and valve replacement/Hx of CHF AICD placement AODM Electrocution 1979 Hx of nephrolithiasis Hx of Anemia Limited DNR Antibiotics:completed   DVT: Heparin/SCD only right now.   Plan:  Advance diet and home when he is ready from Medicine standpoint.  He can shower leave glue alone. Tylenol/oxycodone for pain.  He can drive when he is off all pain medicines.  Follow up is in the AVS.    LOS: 4 days    Roger Curry 10/09/2015

## 2015-10-09 NOTE — Discharge Summary (Signed)
Physician Discharge Summary  Roger Curry WGN:[id number] DOB: 12/10/1934 DOA: 10/05/2015  PCP: Irena Reichmann, DO  Admit date: 10/05/2015 Discharge date: 10/09/2015  Time spent: 35 minutes  Recommendations for Outpatient Follow-up:  Discharge home with outpatient follow-up with PCP and [place] surgery. Advance diet slowly. Recommend no lifting more than 10 pounds for 2-4 weeks.   Discharge Diagnoses:  Principal Problem:   Acute gallstone pancreatitis   Active Problems:   Diabetes mellitus type 2, controlled (HCC)   Essential hypertension   CAD (coronary artery disease)   Normocytic normochromic anemia   Transaminitis   Systolic CHF, chronic (HCC)   Discharge Condition: Fair  Diet recommendation: Heart healthy/diabetic   Filed Weights   10/07/15 0100 10/08/15 0428 10/09/15 0448  Weight: 97 kg (213 lb 13.5 oz) 98.8 kg (217 lb 13 oz) 95.6 kg (210 lb 12.2 oz)    History of present illness:  Please refer to admission H&P for details, in brief, 81 year old male with history of CAD status post CABG in the 90s, history of pacemaker about 2 years back (follows with Dr. Sampson Goon in [facility]), type 2 diabetes mellitus, hypertension and hyperlipidemia, was referred to [facility] ED by his PCP for acute onset of abdominal pain. CT scan done there showed acute gallstone pancreatitis. AST and ALT were elevated in the 600s and lipase of 8000. Patient was transferred to [facility]. GI and surgery consulted. could not get MRCP due to his pacemaker status. He underwent ERCP showing nondilated extrahepatic biliary tree and no stones were delivered into the duodenum. Patient underwent laproscopic cholecystectomy on 2/24.  Hospital Course:  Acute gallstone pancreatitis ERCP done and patient likely had passed the stone. No further abdominal pain. LFTs and lipase trended down to normal. [place] surgery following. Laparoscopy cholecystectomy done on 2/24. Tolerated well. -Tolerating full  liquid diet which can be advanced slowly as outpatient. Recommend no lifting heavy weight for 2-4 weeks. Should follow-up with surgery as outpatient. Does not need antibiotic upon discharge. Prescribe short course of when necessary oxycodone for pain.  CAD with history of CABG and ischemic cardiomyopathy -Stable.  2-D echo shows EF of 35-40%, which is around baseline as per his cardiologist Dr. Nicholes Rough at [facility]. .  He had a cardiac cath in 2015 with native CAD with all grafts patent. Did not require preoperative cardiac evaluation. - has a Paediatric nurse and CRT-D/ Bi-ventricular defibrillator. Was last checked on 05/26/2016. -Added low-dose beta bfor discussing with his cardiologist. -Continue baby  aspirin and ACE inhibitor. Can resume statin upon discharge given his LFTs have normalized.   Essential hypertension Stable. Continue amlodipine and lisinopril. Added low dose metoprolol  upon discharge.   type 2 diabetes mellitus Resumed Amaryl.    Urinary retention Does self straight cath at home.      Family Communication: son at bedside Disposition Plan: Home    Consultants:  GI  [place] surgery  Procedures:  CT abdomen and pelvis (done at outside hospital)  ERCP  Antibiotics:  Imipenem 2/21-2/23  cipro 2/23--2/25   Discharge Exam: Filed Vitals:   10/09/15 0448 10/09/15 0848  BP: 125/52 123/60  Pulse: 54 84  Temp: 97.6 F (36.4 C) 97.5 F (36.4 C)  Resp: 18 18     General: Elderly male not in distress  HEENT: No pallor, moist mucosa  Chest: Clear bilaterally  CVS: Normal S1 and S2, no murmurs  GI: Soft, nondistended, nontender, laparoscopic site is clean  Musculoskeletal: Warm, no edema  Discharge Instructions  Current Discharge Medication List    START taking these medications   Details  metoprolol tartrate (LOPRESSOR) 25 MG tablet Take 0.5 tablets (12.5 mg total) by mouth 2 (two) times daily. Qty: 60 tablet, Refills: 0     oxyCODONE (OXY IR/ROXICODONE) 5 MG immediate release tablet Take 1 tablet (5 mg total) by mouth every 6 (six) hours as needed for moderate pain or severe pain. Qty: 15 tablet, Refills: 0      CONTINUE these medications which have NOT CHANGED   Details  amLODipine (NORVASC) 5 MG tablet Take 5 mg by mouth daily.    aspirin EC 81 MG tablet Take 81 mg by mouth daily.    bethanechol (URECHOLINE) 5 MG tablet Take 5 mg by mouth 2 (two) times daily.    cyanocobalamin (,VITAMIN B-12,) 1000 MCG/ML injection Inject 1,000 mcg into the muscle every 30 (thirty) days.    DULoxetine (CYMBALTA) 60 MG capsule Take 60 mg by mouth daily.    furosemide (LASIX) 40 MG tablet Take 60 mg by mouth 2 (two) times daily.    glimepiride (AMARYL) 4 MG tablet Take 4 mg by mouth daily with breakfast.    lisinopril (PRINIVIL,ZESTRIL) 40 MG tablet Take 20 mg by mouth daily.    Omega-3 Fatty Acids (FISH OIL) 1200 MG CAPS Take 1 capsule by mouth 2 (two) times daily.    omeprazole (PRILOSEC) 20 MG capsule Take 20 mg by mouth daily.    potassium chloride SA (K-DUR,KLOR-CON) 20 MEQ tablet Take 10 mEq by mouth daily.    rosuvastatin (CRESTOR) 20 MG tablet Take 10 mg by mouth daily.    tamsulosin (FLOMAX) 0.4 MG CAPS capsule Take 0.4 mg by mouth daily.    traMADol (ULTRAM) 50 MG tablet Take 50 mg by mouth every 6 (six) hours as needed for moderate pain.    triamcinolone cream (KENALOG) 0.1 % Apply 1 application topically 2 (two) times daily.       Allergies  Allergen Reactions  . Penicillins Hives    Childhood allergy  . Quinine Derivatives Other (See Comments)    Unspecified   Follow-up Information    Follow up with [facility].   Specialty:  General Surgery   Why:  Your appointment is at 11:15 AM, be at the office 30 minutes early for check in.   Contact information:   [address] [place] [address] [phone]       Follow up with COLLINS, DANA, DO. Schedule an  appointment as soon as possible for a visit in 1 week.   Specialty:  Family Medicine   Contact information:   [address] [place] [address] [phone]        The results of significant diagnostics from this hospitalization (including imaging, microbiology, ancillary and laboratory) are listed below for reference.    Significant Diagnostic Studies: Dg Chest Port 1 View  10/05/2015  CLINICAL DATA:  Shortness of Breath EXAM: PORTABLE CHEST 1 VIEW COMPARISON:  10/04/2015 FINDINGS: Cardiac shadow is stable. Postoperative changes and defibrillator are again noted and stable. Mild increased central vascular congestion is noted without pulmonary edema. No focal infiltrate is seen. No bony abnormality is noted. IMPRESSION: Increased central vascular congestion without significant pulmonary edema. Electronically Signed   By: Alcide Clever M.D.   On: 10/05/2015 07:50   Dg Ercp Biliary & Pancreatic Ducts  10/06/2015  CLINICAL DATA:  Biliary dilatation, concern for choledocholithiasis. EXAM: ERCP CONTRAST:  Per endoscopy report FLUOROSCOPY TIME:  Radiation Exposure Index (as provided by the fluoroscopic device): Not available If the device does not provide the exposure index: Fluoroscopy Time (in minutes and seconds):  3 minutes 12 second Number of Acquired Images:  5 COMPARISON:  10/04/2015 FINDINGS: Guidewires advanced into the pancreatic duct and common bile duct. Contrast injection performed. Contrast refluxes into the gallbladder. Cholelithiasis evident. Balloon sweep performed of the extrahepatic duct. No biliary dilatation or obstruction appreciated by the limited imaging. IMPRESSION: Cholelithiasis.  Patent biliary system.  Negative for obstruction. Electronically Signed   By: Judie Petit.  Shick M.D.   On: 10/06/2015 15:38    Microbiology: Recent Results (from the past 240 hour(s))  Surgical pcr screen     Status: None   Collection Time: 10/08/15  4:38 AM  Result Value Ref Range Status   MRSA, PCR  NEGATIVE NEGATIVE Final   Staphylococcus aureus NEGATIVE NEGATIVE Final    Comment:        The Xpert SA Assay (FDA approved for NASAL specimens in patients over 66 years of age), is one component of a comprehensive surveillance program.  Test performance has been validated by [facility] for patients greater than or equal to 62 year old. It is not intended to diagnose infection nor to guide or monitor treatment.      Labs: Basic Metabolic Panel:  Recent Labs Lab 10/05/15 0118 10/06/15 0938 10/07/15 0541 10/08/15 0550 10/09/15 0609  NA 142 139 139 139 139  K 4.1 3.7 3.2* 3.4* 3.1*  CL 105 105 102 101 98*  CO2 GLUCOSE 86 153* 181* 124* 151*  BUN CREATININE 1.24 0.96 0.97 1.07 1.00  CALCIUM 8.4* 8.7* 8.3* 8.4* 8.3*   Liver Function Tests:  Recent Labs Lab 10/05/15 0118 10/06/15 0938 10/07/15 0541 10/08/15 0550 10/09/15 0609  AST 222* 67* 42* 36 33  ALT 283* 156* 105* 82* 65*  ALKPHOS 95 102 87 84 82  BILITOT 4.0* 1.2 0.9 1.0 0.9  PROT 6.3* 6.6 6.1* 6.3* 6.0*  ALBUMIN 3.4* 3.3* 3.0* 3.2* 3.0*    Recent Labs Lab 10/05/15 0118 10/06/15 0938 10/07/15 0541 10/08/15 0550  LIPASE 382* 93* 85* 104*   No results for input(s): AMMONIA in the last 168 hours. CBC:  Recent Labs Lab 10/05/15 0118 10/06/15 0938 10/07/15 0541 10/08/15 0550 10/09/15 0609  WBC 8.2 7.8 5.5 5.9 6.2  NEUTROABS 6.4  --   --   --   --   HGB 11.1* 11.5* 11.2* 11.4* 10.6*  HCT 34.5* 34.7* 32.7* 33.4* 33.0*  MCV 90.3 90.8 89.8 89.3 88.7  PLT 116* 125* 126* 125* 131*   Cardiac Enzymes:  Recent Labs Lab 10/05/15 0118 10/05/15 0511 10/05/15 0944 10/05/15 1551  TROPONINI 0.04* 0.04* 0.03 0.03   BNP: BNP (last 3 results) No results for input(s): BNP in the last 8760 hours.  ProBNP (last 3 results) No results for input(s): PROBNP in the last 8760 hours.  CBG:  Recent Labs Lab 10/08/15 1631 10/08/15 2223 10/09/15 0054 10/09/15 0447  10/09/15 0846  GLUCAP 249* 160* 168* 136* 173*       Signed:  Eddie North MD.  [facility] 10/09/2015, 10:57 AM

## 2015-10-09 NOTE — Progress Notes (Signed)
Pt discharge instructions and prescriptions given, pt and family verbalized understanding.  VSS. Denies pain. Removed Foley catheter, patient self-caths at home 4 times daily.

## 2015-10-11 ENCOUNTER — Encounter (HOSPITAL_COMMUNITY): Payer: Self-pay | Admitting: General Surgery

## 2015-10-28 DIAGNOSIS — I255 Ischemic cardiomyopathy: Secondary | ICD-10-CM | POA: Insufficient documentation

## 2016-08-15 DIAGNOSIS — Z9889 Other specified postprocedural states: Secondary | ICD-10-CM | POA: Insufficient documentation

## 2016-09-28 DIAGNOSIS — M25511 Pain in right shoulder: Secondary | ICD-10-CM | POA: Insufficient documentation

## 2016-09-28 DIAGNOSIS — M5412 Radiculopathy, cervical region: Secondary | ICD-10-CM | POA: Insufficient documentation

## 2017-01-11 ENCOUNTER — Other Ambulatory Visit: Payer: Self-pay | Admitting: Anesthesiology

## 2017-01-11 DIAGNOSIS — M5412 Radiculopathy, cervical region: Secondary | ICD-10-CM

## 2017-01-16 ENCOUNTER — Inpatient Hospital Stay
Admission: RE | Admit: 2017-01-16 | Discharge: 2017-01-16 | Disposition: A | Discharge status: Home / Self Care | Payer: Medicare HMO | Source: Ambulatory Visit | Attending: Anesthesiology | Admitting: Anesthesiology

## 2017-01-16 ENCOUNTER — Other Ambulatory Visit: Payer: Medicare HMO

## 2017-01-16 NOTE — Discharge Instructions (Signed)
Myelogram Discharge Instructions  1. Go home and rest quietly for the next 24 hours.  It is important to lie flat for the next 24 hours.  Get up only to go to the restroom.  You may lie in the bed or on a couch on your back, your stomach, your left side or your right side.  You may have one pillow under your head.  You may have pillows between your knees while you are on your side or under your knees while you are on your back.  2. DO NOT drive today.  Recline the seat as far back as it will go, while still wearing your seat belt, on the way home.  3. You may get up to go to the bathroom as needed.  You may sit up for 10 minutes to eat.  You may resume your normal diet and medications unless otherwise indicated.  Drink lots of extra fluids today and tomorrow.  4. The incidence of headache, nausea, or vomiting is about 5% (one in 20 patients).  If you develop a headache, lie flat and drink plenty of fluids until the headache goes away.  Caffeinated beverages may be helpful.  If you develop severe nausea and vomiting or a headache that does not go away with flat bed rest, call [phone].  5. You may resume normal activities after your 24 hours of bed rest is over; however, do not exert yourself strongly or do any heavy lifting tomorrow. If when you get up you have a headache when standing, go back to bed and force fluids for another 24 hours.  6. Call your physician for a follow-up appointment.  The results of your myelogram will be sent directly to your physician by the following day.  7. If you have any questions or if complications develop after you arrive home, please call [phone].  Discharge instructions have been explained to the patient.  The patient, or the person responsible for the patient, fully understands these instructions.       May resume Cymbalta on January 17, 2017, after 1:00 pm.

## 2017-01-23 ENCOUNTER — Ambulatory Visit
Admission: RE | Admit: 2017-01-23 | Discharge: 2017-01-23 | Disposition: A | Discharge status: Home / Self Care | Payer: Medicare HMO | Source: Ambulatory Visit | Attending: Anesthesiology | Admitting: Anesthesiology

## 2017-01-23 DIAGNOSIS — M5412 Radiculopathy, cervical region: Secondary | ICD-10-CM

## 2017-01-23 MED ORDER — IOPAMIDOL (ISOVUE-M 300) INJECTION 61%
10.0000 mL | Freq: Once | INTRAMUSCULAR | Status: AC | PRN
  Administered 2017-01-23: 10 mL via INTRATHECAL

## 2017-01-23 MED ORDER — DIAZEPAM 5 MG PO TABS
5.0000 mg | ORAL_TABLET | Freq: Once | ORAL | Status: AC
  Administered 2017-01-23: 5 mg via ORAL

## 2017-01-23 NOTE — Discharge Instructions (Signed)
Myelogram Discharge Instructions  1. Go home and rest quietly for the next 24 hours.  It is important to lie flat for the next 24 hours.  Get up only to go to the restroom.  You may lie in the bed or on a couch on your back, your stomach, your left side or your right side.  You may have one pillow under your head.  You may have pillows between your knees while you are on your side or under your knees while you are on your back.  2. DO NOT drive today.  Recline the seat as far back as it will go, while still wearing your seat belt, on the way home.  3. You may get up to go to the bathroom as needed.  You may sit up for 10 minutes to eat.  You may resume your normal diet and medications unless otherwise indicated.  Drink lots of extra fluids today and tomorrow.  4. The incidence of headache, nausea, or vomiting is about 5% (one in 20 patients).  If you develop a headache, lie flat and drink plenty of fluids until the headache goes away.  Caffeinated beverages may be helpful.  If you develop severe nausea and vomiting or a headache that does not go away with flat bed rest, call [phone].  5. You may resume normal activities after your 24 hours of bed rest is over; however, do not exert yourself strongly or do any heavy lifting tomorrow. If when you get up you have a headache when standing, go back to bed and force fluids for another 24 hours.  6. Call your physician for a follow-up appointment.  The results of your myelogram will be sent directly to your physician by the following day.  7. If you have any questions or if complications develop after you arrive home, please call [phone].  Discharge instructions have been explained to the patient.  The patient, or the person responsible for the patient, fully understands these instructions.       May resume Cymbalta on January 24, 2017, after 9:30 am.

## 2017-01-23 NOTE — Progress Notes (Signed)
Pt states he has been off Cymbalta since Saturday.

## 2017-03-12 ENCOUNTER — Other Ambulatory Visit: Payer: Self-pay | Admitting: Neurosurgery

## 2017-04-02 DIAGNOSIS — E782 Mixed hyperlipidemia: Secondary | ICD-10-CM | POA: Insufficient documentation

## 2018-05-17 ENCOUNTER — Other Ambulatory Visit: Payer: Self-pay | Admitting: Orthopaedic Surgery

## 2018-05-17 DIAGNOSIS — M546 Pain in thoracic spine: Secondary | ICD-10-CM

## 2018-05-17 DIAGNOSIS — G8929 Other chronic pain: Secondary | ICD-10-CM

## 2018-05-17 DIAGNOSIS — M545 Low back pain, unspecified: Secondary | ICD-10-CM

## 2018-05-30 ENCOUNTER — Ambulatory Visit
Admission: RE | Admit: 2018-05-30 | Discharge: 2018-05-30 | Disposition: A | Discharge status: Home / Self Care | Payer: Medicare HMO | Source: Ambulatory Visit | Attending: Orthopaedic Surgery | Admitting: Orthopaedic Surgery

## 2018-05-30 VITALS — BP 151/72 | HR 56

## 2018-05-30 DIAGNOSIS — G8929 Other chronic pain: Secondary | ICD-10-CM

## 2018-05-30 DIAGNOSIS — M545 Low back pain, unspecified: Secondary | ICD-10-CM

## 2018-05-30 DIAGNOSIS — M961 Postlaminectomy syndrome, not elsewhere classified: Secondary | ICD-10-CM

## 2018-05-30 DIAGNOSIS — M5126 Other intervertebral disc displacement, lumbar region: Secondary | ICD-10-CM

## 2018-05-30 DIAGNOSIS — M546 Pain in thoracic spine: Secondary | ICD-10-CM

## 2018-05-30 DIAGNOSIS — M5137 Other intervertebral disc degeneration, lumbosacral region: Secondary | ICD-10-CM

## 2018-05-30 MED ORDER — IOPAMIDOL (ISOVUE-M 300) INJECTION 61%
10.0000 mL | Freq: Once | INTRAMUSCULAR | Status: AC | PRN
  Administered 2018-05-30: 10 mL via INTRATHECAL

## 2018-05-30 MED ORDER — DIAZEPAM 5 MG PO TABS
5.0000 mg | ORAL_TABLET | Freq: Once | ORAL | Status: AC
  Administered 2018-05-30: 5 mg via ORAL

## 2018-05-30 NOTE — Discharge Instructions (Signed)

## 2018-10-30 DIAGNOSIS — L0231 Cutaneous abscess of buttock: Secondary | ICD-10-CM | POA: Insufficient documentation

## 2019-01-27 DIAGNOSIS — H9313 Tinnitus, bilateral: Secondary | ICD-10-CM | POA: Insufficient documentation

## 2019-05-14 DIAGNOSIS — G5621 Lesion of ulnar nerve, right upper limb: Secondary | ICD-10-CM | POA: Insufficient documentation

## 2019-05-15 ENCOUNTER — Other Ambulatory Visit: Payer: Self-pay | Admitting: Orthopedic Surgery

## 2019-06-17 ENCOUNTER — Encounter (HOSPITAL_BASED_OUTPATIENT_CLINIC_OR_DEPARTMENT_OTHER): Payer: Self-pay

## 2019-06-17 ENCOUNTER — Ambulatory Visit (HOSPITAL_BASED_OUTPATIENT_CLINIC_OR_DEPARTMENT_OTHER): Admit: 2019-06-17 | Payer: Medicare HMO | Admitting: Orthopedic Surgery

## 2019-06-17 SURGERY — CARPAL TUNNEL RELEASE
Anesthesia: Regional | Laterality: Left

## 2019-06-30 DIAGNOSIS — R102 Pelvic and perineal pain: Secondary | ICD-10-CM | POA: Insufficient documentation

## 2019-09-03 DIAGNOSIS — I480 Paroxysmal atrial fibrillation: Secondary | ICD-10-CM | POA: Insufficient documentation

## 2019-10-16 DIAGNOSIS — G8929 Other chronic pain: Secondary | ICD-10-CM | POA: Insufficient documentation

## 2019-12-08 NOTE — Progress Notes (Signed)
Can you please place orders for the upcoming surgery.Pt. PST appointment is 12/09/19.Thank you.

## 2019-12-08 NOTE — Patient Instructions (Addendum)
DUE TO COVID-19 ONLY ONE VISITOR IS ALLOWED TO COME WITH YOU AND STAY IN THE WAITING ROOM ONLY DURING PRE OP AND PROCEDURE DAY OF SURGERY. THE 1 VISITOR MAY VISIT WITH YOU AFTER SURGERY IN YOUR PRIVATE ROOM DURING VISITING HOURS ONLY!  YOU NEED TO HAVE A COVID 19 TEST ON: 12/11/19 @ 8:30 am , THIS TEST MUST BE DONE BEFORE SURGERY, COME  [address], [place] , [address].  ([place]) ONCE YOUR COVID TEST IS COMPLETED, PLEASE BEGIN THE QUARANTINE INSTRUCTIONS AS OUTLINED IN YOUR HANDOUT.                [place]    Your procedure is scheduled on: 12/15/19   Report to [facility] Main  Entrance   Report to short stay at: 5:30 AM     Call this number if you have problems the morning of surgery [phone]    Remember: Do not eat food or drink liquids :After Midnight.   BRUSH YOUR TEETH MORNING OF SURGERY AND RINSE YOUR MOUTH OUT, NO CHEWING GUM CANDY OR MINTS.     Take these medicines the morning of surgery with A SIP OF WATER: amlodipine,gabapentin,omeprazole,tamsulosin,trimethoprim. How to Manage Your Diabetes Before and After Surgery  Why is it important to control my blood sugar before and after surgery? . Improving blood sugar levels before and after surgery helps healing and can limit problems. . A way of improving blood sugar control is eating a healthy diet by: o  Eating less sugar and carbohydrates o  Increasing activity/exercise o  Talking with your doctor about reaching your blood sugar goals . High blood sugars (greater than 180 mg/dL) can raise your risk of infections and slow your recovery, so you will need to focus on controlling your diabetes during the weeks before surgery. . Make sure that the doctor who takes care of your diabetes knows about your planned surgery including the date and location.  How do I manage my blood sugar before surgery? . Check your blood sugar at least 4 times a day, starting 2 days before surgery, to make  sure that the level is not too high or low. o Check your blood sugar the morning of your surgery when you wake up and every 2 hours until you get to the Short Stay unit. . If your blood sugar is less than 70 mg/dL, you will need to treat for low blood sugar: o Do not take insulin. o Treat a low blood sugar (less than 70 mg/dL) with  cup of clear juice (cranberry or apple), 4 glucose tablets, OR glucose gel. o Recheck blood sugar in 15 minutes after treatment (to make sure it is greater than 70 mg/dL). If your blood sugar is not greater than 70 mg/dL on recheck, call [phone] for further instructions. . Report your blood sugar to the short stay nurse when you get to Short Stay.  . If you are admitted to the hospital after surgery: o Your blood sugar will be checked by the staff and you will probably be given insulin after surgery (instead of oral diabetes medicines) to make sure you have good blood sugar levels. o The goal for blood sugar control after surgery is 80-180 mg/dL.   WHAT DO I DO ABOUT MY DIABETES MEDICATION?  Marland Kitchen Do not take oral diabetes medicines (pills) the morning of surgery    . The day of surgery, do not take other diabetes injectables, including Byetta (exenatide), Bydureon (exenatide  ER), Victoza (liraglutide), or Trulicity (dulaglutide).                                 You may not have any metal on your body including hair pins and              piercings  Do not wear jewelry, lotions, powders or perfumes, deodorant             Men may shave face and neck.   Do not bring valuables to the hospital. [facility] IS NOT             RESPONSIBLE   FOR VALUABLES.  Contacts, dentures or bridgework may not be worn into surgery.  Leave suitcase in the car. After surgery it may be brought to your room.     Patients discharged the day of surgery will not be allowed to drive home. IF YOU ARE HAVING SURGERY AND GOING HOME THE SAME DAY, YOU MUST HAVE AN ADULT TO DRIVE YOU HOME  AND BE WITH YOU FOR 24 HOURS. YOU MAY GO HOME BY TAXI OR UBER OR ORTHERWISE, BUT AN ADULT MUST ACCOMPANY YOU HOME AND STAY WITH YOU FOR 24 HOURS.  Name and phone number of your driver:  Special Instructions: N/A              Please read over the following fact sheets you were given: _____________________________________________________________________     [facility] - Preparing for Surgery Before surgery, you can play an important role.  Because skin is not sterile, your skin needs to be as free of germs as possible.  You can reduce the number of germs on your skin by washing with CHG (chlorahexidine gluconate) soap before surgery.  CHG is an antiseptic cleaner which kills germs and bonds with the skin to continue killing germs even after washing. Please DO NOT use if you have an allergy to CHG or antibacterial soaps.  If your skin becomes reddened/irritated stop using the CHG and inform your nurse when you arrive at Short Stay. Do not shave (including legs and underarms) for at least 48 hours prior to the first CHG shower.  You may shave your face/neck. Please follow these instructions carefully:  1.  Shower with CHG Soap the night before surgery and the  morning of Surgery.  2.  If you choose to wash your hair, wash your hair first as usual with your  normal  shampoo.  3.  After you shampoo, rinse your hair and body thoroughly to remove the  shampoo.                           4.  Use CHG as you would any other liquid soap.  You can apply chg directly  to the skin and wash                       Gently with a scrungie or clean washcloth.  5.  Apply the CHG Soap to your body ONLY FROM THE NECK DOWN.   Do not use on face/ open                           Wound or open sores. Avoid contact with eyes, ears mouth and genitals (private parts).  Wash face,  Genitals (private parts) with your normal soap.             6.  Wash thoroughly, paying special attention to the area where  your surgery  will be performed.  7.  Thoroughly rinse your body with warm water from the neck down.  8.  DO NOT shower/wash with your normal soap after using and rinsing off  the CHG Soap.                9.  Pat yourself dry with a clean towel.            10.  Wear clean pajamas.            11.  Place clean sheets on your bed the night of your first shower and do not  sleep with pets. Day of Surgery : Do not apply any lotions/deodorants the morning of surgery.  Please wear clean clothes to the hospital/surgery center.  FAILURE TO FOLLOW THESE INSTRUCTIONS MAY RESULT IN THE CANCELLATION OF YOUR SURGERY PATIENT SIGNATURE_________________________________  NURSE SIGNATURE__________________________________  ________________________________________________________________________

## 2019-12-09 ENCOUNTER — Encounter (HOSPITAL_COMMUNITY)
Admission: RE | Admit: 2019-12-09 | Discharge: 2019-12-09 | Disposition: A | Discharge status: Home / Self Care | Payer: Medicare HMO | Source: Ambulatory Visit | Attending: Orthopedic Surgery | Admitting: Orthopedic Surgery

## 2019-12-09 ENCOUNTER — Other Ambulatory Visit: Payer: Self-pay

## 2019-12-09 ENCOUNTER — Other Ambulatory Visit: Payer: Self-pay | Admitting: Orthopedic Surgery

## 2019-12-09 ENCOUNTER — Encounter (HOSPITAL_COMMUNITY): Payer: Self-pay

## 2019-12-09 DIAGNOSIS — N183 Chronic kidney disease, stage 3 unspecified: Secondary | ICD-10-CM | POA: Insufficient documentation

## 2019-12-09 DIAGNOSIS — Z79899 Other long term (current) drug therapy: Secondary | ICD-10-CM | POA: Insufficient documentation

## 2019-12-09 DIAGNOSIS — Z95810 Presence of automatic (implantable) cardiac defibrillator: Secondary | ICD-10-CM | POA: Diagnosis not present

## 2019-12-09 DIAGNOSIS — Z951 Presence of aortocoronary bypass graft: Secondary | ICD-10-CM | POA: Insufficient documentation

## 2019-12-09 DIAGNOSIS — I2510 Atherosclerotic heart disease of native coronary artery without angina pectoris: Secondary | ICD-10-CM | POA: Diagnosis not present

## 2019-12-09 DIAGNOSIS — M1712 Unilateral primary osteoarthritis, left knee: Secondary | ICD-10-CM | POA: Diagnosis not present

## 2019-12-09 DIAGNOSIS — Z981 Arthrodesis status: Secondary | ICD-10-CM | POA: Diagnosis not present

## 2019-12-09 DIAGNOSIS — E1122 Type 2 diabetes mellitus with diabetic chronic kidney disease: Secondary | ICD-10-CM | POA: Insufficient documentation

## 2019-12-09 DIAGNOSIS — Z87891 Personal history of nicotine dependence: Secondary | ICD-10-CM | POA: Insufficient documentation

## 2019-12-09 DIAGNOSIS — Z7982 Long term (current) use of aspirin: Secondary | ICD-10-CM | POA: Diagnosis not present

## 2019-12-09 DIAGNOSIS — Z01818 Encounter for other preprocedural examination: Secondary | ICD-10-CM | POA: Insufficient documentation

## 2019-12-09 DIAGNOSIS — R9431 Abnormal electrocardiogram [ECG] [EKG]: Secondary | ICD-10-CM | POA: Diagnosis not present

## 2019-12-09 DIAGNOSIS — I255 Ischemic cardiomyopathy: Secondary | ICD-10-CM | POA: Insufficient documentation

## 2019-12-09 HISTORY — DX: Unspecified osteoarthritis, unspecified site: M19.90

## 2019-12-09 LAB — SURGICAL PCR SCREEN
MRSA, PCR: NEGATIVE
Staphylococcus aureus: NEGATIVE

## 2019-12-09 LAB — BASIC METABOLIC PANEL
Anion gap: 11 (ref 5–15)
BUN: 27 mg/dL — ABNORMAL HIGH (ref 8–23)
CO2: 28 mmol/L (ref 22–32)
Calcium: 9.3 mg/dL (ref 8.9–10.3)
Chloride: 99 mmol/L (ref 98–111)
Creatinine, Ser: 1.06 mg/dL (ref 0.61–1.24)
GFR calc Af Amer: >60 mL/min (ref >60)
GFR calc non Af Amer: >60 mL/min (ref >60)
Glucose, Bld: 160 mg/dL — ABNORMAL HIGH (ref 70–99)
Potassium: 5 mmol/L (ref 3.5–5.1)
Sodium: 138 mmol/L (ref 135–145)

## 2019-12-09 LAB — CBC
HCT: 39.1 % (ref 39.0–52.0)
Hemoglobin: 12.8 g/dL — ABNORMAL LOW (ref 13.0–17.0)
MCH: 29.7 pg (ref 26.0–34.0)
MCHC: 32.7 g/dL (ref 30.0–36.0)
MCV: 90.7 fL (ref 80.0–100.0)
Platelets: 134 10*3/uL — ABNORMAL LOW (ref 150–400)
RBC: 4.31 MIL/uL (ref 4.22–5.81)
RDW: 13.2 % (ref 11.5–15.5)
WBC: 8.4 10*3/uL (ref 4.0–10.5)
nRBC: 0 % (ref 0.0–0.2)

## 2019-12-09 LAB — HEMOGLOBIN A1C
Hgb A1c MFr Bld: 7.1 % — ABNORMAL HIGH (ref 4.8–5.6)
Mean Plasma Glucose: 157.07 mg/dL

## 2019-12-09 LAB — GLUCOSE, CAPILLARY: Glucose-Capillary: 155 mg/dL — ABNORMAL HIGH (ref 70–99)

## 2019-12-09 NOTE — Progress Notes (Addendum)
PCP - Dr. Keturah Barre. LOV: 10/14/19 Cardiologist - Dr. Vernie Ammons. LOV: 09/04/19  Chest x-ray -  EKG -  Stress Test -  ECHO -  Cardiac Cath -   Sleep Study - yes CPAP - no  Fasting Blood Sugar - 140 Checks Blood Sugar __2__ times a day  Blood Thinner Instructions: Aspirin Instructions: Last Dose:12/06/19  Anesthesia review: Hx. CABG,CAD<CHF<Aflutter,DIA. ICD implant.  Patient denies shortness of breath, fever, cough and chest pain at PAT appointment   Patient verbalized understanding of instructions that were given to them at the PAT appointment. Patient was also instructed that they will need to review over the PAT instructions again at home before surgery.

## 2019-12-11 ENCOUNTER — Other Ambulatory Visit (HOSPITAL_COMMUNITY)
Admission: RE | Admit: 2019-12-11 | Discharge: 2019-12-11 | Disposition: A | Discharge status: Home / Self Care | Payer: Medicare HMO | Source: Ambulatory Visit | Attending: Orthopedic Surgery | Admitting: Orthopedic Surgery

## 2019-12-11 DIAGNOSIS — Z01812 Encounter for preprocedural laboratory examination: Secondary | ICD-10-CM | POA: Insufficient documentation

## 2019-12-11 DIAGNOSIS — Z20822 Contact with and (suspected) exposure to covid-19: Secondary | ICD-10-CM | POA: Insufficient documentation

## 2019-12-11 LAB — SARS CORONAVIRUS 2 (TAT 6-24 HRS): SARS Coronavirus 2: NEGATIVE

## 2019-12-11 NOTE — Progress Notes (Signed)
Anesthesia Chart Review   Case: [id number] Date/Time: 12/15/19 0745   Procedure: TOTAL KNEE ARTHROPLASTY (Left Knee)   Anesthesia type: Spinal   Pre-op diagnosis: Osteoarthritis left knee   Location: [facility] ROOM 05 / [facility]   Surgeons: Vickey Huger, MD      DISCUSSION:85 y.o. former smoker with h/o paroxysmal atrial flutter (s/p ablation), CAD (CABG), ischemic cardiomyopathy (EF 46% on Echo 08/27/2018), AICD in place (Device orders on chart), CKD stage III, DM II, left knee OA scheduled for above procedure 12/15/2019 with Dr. Vickey Huger.   Pt last seen by cardiologist, Dr. Ola Spurr, 08/13/2019.  Stable at this visit. Clearance received which states pt is optimized for surgery from a medical and cardiac standpoint (on chart).   Anticipate pt can proceed with planned procedure barring acute status change.   VS: BP (!) 146/67 (BP Location: Right Arm)   Pulse 81   Temp 37 C (Oral)   Resp 18   Ht 5\' 6"  (1.676 m)   Wt 84.6 kg   SpO2 98%   BMI 30.10 kg/m   PROVIDERS: Myrlene Broker, MD is PCP   Adrian Prows, MD is Cardiologist  LABS: Labs reviewed: Acceptable for surgery. (all labs ordered are listed, but only abnormal results are displayed)  Labs Reviewed  BASIC METABOLIC PANEL - Abnormal; Notable for the following components:      Result Value   Glucose, Bld 160 (*)    BUN 27 (*)    All other components within normal limits  CBC - Abnormal; Notable for the following components:   Hemoglobin 12.8 (*)    Platelets 134 (*)    All other components within normal limits  HEMOGLOBIN A1C - Abnormal; Notable for the following components:   Hgb A1c MFr Bld 7.1 (*)    All other components within normal limits  GLUCOSE, CAPILLARY - Abnormal; Notable for the following components:   Glucose-Capillary 155 (*)    All other components within normal limits  SURGICAL PCR SCREEN     IMAGES:   EKG: 12/09/2019 Rate 79 bpm AV dual-paced rhythm with prolonged AV conduction  Abnormal  ECG No significant change since last tracing   CV: Echo 08/27/2018 Findings Mitral Valve Structurally normal mitral valve. trace mitral regurgitation. Aortic Valve There is mild aortic sclerosis noted, with no evidence of stenosis. Trace aortic regurgitation. Tricuspid Valve Tricuspid valve is structurally normal. Trace tricuspid regurgitation. Pulmonic Valve Pulmonic valve is structurally normal. Mild pulmonic regurgitation. Left Atrium Moderately dilated left atrium. Left atrial volume index of 49.3 ml per meters squared BSA. Left Ventricle Ejection fraction is 46% Mild global LV hypokinesis. Borderline concentric left ventricular hypertrophy LV is mildly dilated. Right Atrium Normal right atrium. Right Ventricle Normal right ventricular size and function. Pacer and/or defibrillator wires visualized in right ventricle. Pericardial Effusion No evidence of pericardial effusion. Pleural Effusion No evidence of pleural effusion. Miscellaneous Borderline dilation of the ascending aorta. The IVC is normal  Echo 10/05/2015 Study Conclusions   - Left ventricle: The cavity size was moderately dilated. Systolic  function was moderately reduced. The estimated ejection fraction  was in the range of 35% to 40%. Moderate diffuse hypokinesis.  - Aortic valve: Trileaflet; normal thickness, mildly calcified  leaflets.  - Left atrium: The atrium was severely dilated.  Past Medical History:  Diagnosis Date  . Arthritis   . Atrial flutter [facility])    ? s/p ablation at [facility].  references in Ct reprt to variant pullmonary venous anatomy and pending  pulmonary venous ablation.in 04/2013  . Bladder dysfunction    self  caths 4 x daily.   Marland Kitchen CAD (coronary artery disease)   . CHF (congestive heart failure) (HCC)    EF on echo 09/2015 35 to 40%.   . Closed angle glaucoma   . Electrocution 1970s  . Renal stones   . Spinal stenosis    lumbar and cervical.   . Type 2 diabetes mellitus  (HCC)     Past Surgical History:  Procedure Laterality Date  . BALLOON ANGIOPLASTY, ARTERY  1992  . BILATERAL HEMI SHOULDER ARTHROPLASTY Left   . CHOLECYSTECTOMY N/A 10/08/2015   Procedure: LAPAROSCOPIC CHOLECYSTECTOMY;  Surgeon: Violeta Gelinas, MD;  Location: [facility] OR;  Service: General;  Laterality: N/A;  . CORONARY ARTERY BYPASS GRAFT  1996  . CYSTOSCOPY    . ERCP N/A 10/06/2015   Procedure: ENDOSCOPIC RETROGRADE CHOLANGIOPANCREATOGRAPHY (ERCP);  Surgeon: Rachael Fee, MD;  Location: [facility] ENDOSCOPY;  Service: Endoscopy;  Laterality: N/A;  . ESOPHAGOGASTRODUODENOSCOPY  2014   in [place]  . GLAUCOMA SURGERY    . INSERTION OF ICD  2014  . LUMBAR SPINE SURGERY  1997, 2009   1997 l spine fusion.  2009 lumbar laminectomy, discectomy, fusion.  says he has had total of 4 surgeries.    MEDICATIONS: . amLODipine (NORVASC) 5 MG tablet  . aspirin EC 81 MG tablet  . cyanocobalamin (,VITAMIN B-12,) 1000 MCG/ML injection  . furosemide (LASIX) 40 MG tablet  . gabapentin (NEURONTIN) 600 MG tablet  . lisinopril (ZESTRIL) 20 MG tablet  . Omega-3 Fatty Acids (FISH OIL) 1200 MG CAPS  . omeprazole (PRILOSEC) 20 MG capsule  . rosuvastatin (CRESTOR) 20 MG tablet  . tamsulosin (FLOMAX) 0.4 MG CAPS capsule  . trimethoprim (TRIMPEX) 100 MG tablet  . TRULICITY 0.75 MG/0.5ML SOPN   No current facility-administered medications for this encounter.    Janey Genta [facility] Pre-Surgical Testing [phone] 12/11/19  11:28 AM

## 2019-12-13 NOTE — Anesthesia Preprocedure Evaluation (Addendum)
Anesthesia Evaluation  Patient identified by MRN, date of birth, ID band Patient awake    Reviewed: Allergy & Precautions, NPO status , Patient's Chart, lab work & pertinent test results  History of Anesthesia Complications Negative for: history of anesthetic complications  Airway Mallampati: I       Dental no notable dental hx. (+) Teeth Intact   Pulmonary former smoker,    Pulmonary exam normal        Cardiovascular hypertension, Pt. on medications + CAD, + CABG and +CHF  Normal cardiovascular exam+ dysrhythmias Atrial Fibrillation + pacemaker  Rhythm:Regular Rate:Normal  Study Conclusions  - Left ventricle: The cavity size was moderately dilated. Systolic function was moderately reduced. The estimated ejection fraction was in the range of 35% to 40%. Moderate diffuse hypokinesis. - Aortic valve: Trileaflet; normal thickness, mildly calcified leaflets. - Left atrium: The atrium was severely dilated.    Neuro/Psych negative psych ROS   GI/Hepatic Neg liver ROS, GERD  Medicated,  Endo/Other  diabetes, Well Controlled, Type 2  Renal/GU   negative genitourinary   Musculoskeletal  (+) Arthritis , Osteoarthritis,    Abdominal Normal abdominal exam  (+)   Peds  Hematology  (+) anemia ,   Anesthesia Other Findings  Patient:  Roger Curry, Roger Curry  MR #:    [id number]  Study Date: 10/05/2015  Gender:   M  Age:    85  Height:   167.6 cm  Weight:   97.3 kg  BSA:    2.17 m^2  Pt. Status:  Room:    6E15C   ADMITTING  Beverely Risen  ATTENDING  [place], [place] [id number]  SONOGRAPHER Arvil Chaco  PERFORMING  [facility], Inpatient   cc:   -------------------------------------------------------------------  LV EF: 35% -  40%   -------------------------------------------------------------------  Indications:   V728.1 Pre-op evaluation.    -------------------------------------------------------------------  History:  PMH: Gallstone pancreatitis. CAD S/P CABG and valve  replacement. AICD in situ.   -------------------------------------------------------------------  Study Conclusions   - Left ventricle: The cavity size was moderately dilated. Systolic  function was moderately reduced. The estimated ejection fraction  was in the range of 35% to 40%. Moderate diffuse hypokinesis.  - Aortic valve: Trileaflet; normal thickness, mildly calcified  leaflets.  - Left atrium: The atrium was severely dilated.   Transthoracic echocardiography. M-mode, complete 2D, spectral  Doppler, and color Doppler. Birthdate: Patient birthdate:  [birth year]. Age: Patient is 85 yr old. Sex: Gender: male.  BMI: 34.6 kg/m^2. Blood pressure:   142/62 Patient status:  Inpatient. Study date: Study date: 10/05/2015. Study time: 04:21  PM. Location: Bedside.   -------------------------------------------------------------------  Reproductive/Obstetrics                           Anesthesia Physical  Anesthesia Plan  ASA: III  Anesthesia Plan: Spinal   Post-op Pain Management:  Regional for Post-op pain   Induction:   PONV Risk Score and Plan: Ondansetron  Airway Management Planned: Natural Airway, Nasal Cannula and Simple Face Mask  Additional Equipment: None  Intra-op Plan:   Post-operative Plan:   Informed Consent: I have reviewed the patients History and Physical, chart, labs and discussed the procedure including the risks, benefits and alternatives for the proposed anesthesia with the patient or authorized representative who has indicated his/her understanding and acceptance.       Plan Discussed with: CRNA  Anesthesia Plan Comments:  Anesthesia Quick Evaluation  

## 2019-12-14 MED ORDER — BUPIVACAINE LIPOSOME 1.3 % IJ SUSP
20.0000 mL | Freq: Once | INTRAMUSCULAR | Status: DC
  Filled 2019-12-14: qty 20

## 2019-12-15 ENCOUNTER — Encounter (HOSPITAL_COMMUNITY)
Admission: RE | Disposition: A | Discharge status: Home / Self Care | Payer: Self-pay | Source: Ambulatory Visit | Attending: Orthopedic Surgery

## 2019-12-15 ENCOUNTER — Ambulatory Visit (HOSPITAL_COMMUNITY): Payer: Medicare HMO | Admitting: Physician Assistant

## 2019-12-15 ENCOUNTER — Encounter (HOSPITAL_COMMUNITY): Payer: Self-pay | Admitting: Orthopedic Surgery

## 2019-12-15 ENCOUNTER — Ambulatory Visit (HOSPITAL_COMMUNITY): Payer: Medicare HMO | Admitting: Anesthesiology

## 2019-12-15 ENCOUNTER — Observation Stay (HOSPITAL_COMMUNITY)
Admission: RE | Admit: 2019-12-15 | Discharge: 2019-12-16 | Disposition: A | Discharge status: Home / Self Care | Payer: Medicare HMO | Source: Ambulatory Visit | Attending: Orthopedic Surgery | Admitting: Orthopedic Surgery

## 2019-12-15 ENCOUNTER — Other Ambulatory Visit: Payer: Self-pay

## 2019-12-15 DIAGNOSIS — E1139 Type 2 diabetes mellitus with other diabetic ophthalmic complication: Secondary | ICD-10-CM | POA: Diagnosis not present

## 2019-12-15 DIAGNOSIS — E782 Mixed hyperlipidemia: Secondary | ICD-10-CM | POA: Insufficient documentation

## 2019-12-15 DIAGNOSIS — Z87891 Personal history of nicotine dependence: Secondary | ICD-10-CM | POA: Insufficient documentation

## 2019-12-15 DIAGNOSIS — E1122 Type 2 diabetes mellitus with diabetic chronic kidney disease: Secondary | ICD-10-CM | POA: Insufficient documentation

## 2019-12-15 DIAGNOSIS — N319 Neuromuscular dysfunction of bladder, unspecified: Secondary | ICD-10-CM | POA: Diagnosis not present

## 2019-12-15 DIAGNOSIS — E538 Deficiency of other specified B group vitamins: Secondary | ICD-10-CM | POA: Insufficient documentation

## 2019-12-15 DIAGNOSIS — N400 Enlarged prostate without lower urinary tract symptoms: Secondary | ICD-10-CM | POA: Insufficient documentation

## 2019-12-15 DIAGNOSIS — I2510 Atherosclerotic heart disease of native coronary artery without angina pectoris: Secondary | ICD-10-CM | POA: Diagnosis not present

## 2019-12-15 DIAGNOSIS — I509 Heart failure, unspecified: Secondary | ICD-10-CM | POA: Insufficient documentation

## 2019-12-15 DIAGNOSIS — I4891 Unspecified atrial fibrillation: Secondary | ICD-10-CM | POA: Diagnosis not present

## 2019-12-15 DIAGNOSIS — D649 Anemia, unspecified: Secondary | ICD-10-CM | POA: Insufficient documentation

## 2019-12-15 DIAGNOSIS — I130 Hypertensive heart and chronic kidney disease with heart failure and stage 1 through stage 4 chronic kidney disease, or unspecified chronic kidney disease: Secondary | ICD-10-CM | POA: Diagnosis not present

## 2019-12-15 DIAGNOSIS — H42 Glaucoma in diseases classified elsewhere: Secondary | ICD-10-CM | POA: Insufficient documentation

## 2019-12-15 DIAGNOSIS — E1142 Type 2 diabetes mellitus with diabetic polyneuropathy: Secondary | ICD-10-CM | POA: Insufficient documentation

## 2019-12-15 DIAGNOSIS — M5137 Other intervertebral disc degeneration, lumbosacral region: Secondary | ICD-10-CM | POA: Diagnosis not present

## 2019-12-15 DIAGNOSIS — N183 Chronic kidney disease, stage 3 unspecified: Secondary | ICD-10-CM | POA: Diagnosis not present

## 2019-12-15 DIAGNOSIS — M4802 Spinal stenosis, cervical region: Secondary | ICD-10-CM | POA: Diagnosis not present

## 2019-12-15 DIAGNOSIS — M5126 Other intervertebral disc displacement, lumbar region: Secondary | ICD-10-CM | POA: Diagnosis not present

## 2019-12-15 DIAGNOSIS — Z823 Family history of stroke: Secondary | ICD-10-CM | POA: Insufficient documentation

## 2019-12-15 DIAGNOSIS — Z8249 Family history of ischemic heart disease and other diseases of the circulatory system: Secondary | ICD-10-CM | POA: Insufficient documentation

## 2019-12-15 DIAGNOSIS — I255 Ischemic cardiomyopathy: Secondary | ICD-10-CM | POA: Insufficient documentation

## 2019-12-15 DIAGNOSIS — K219 Gastro-esophageal reflux disease without esophagitis: Secondary | ICD-10-CM | POA: Diagnosis not present

## 2019-12-15 DIAGNOSIS — Z7982 Long term (current) use of aspirin: Secondary | ICD-10-CM | POA: Insufficient documentation

## 2019-12-15 DIAGNOSIS — M1712 Unilateral primary osteoarthritis, left knee: Secondary | ICD-10-CM | POA: Diagnosis not present

## 2019-12-15 DIAGNOSIS — Z880 Allergy status to penicillin: Secondary | ICD-10-CM | POA: Insufficient documentation

## 2019-12-15 DIAGNOSIS — Z96659 Presence of unspecified artificial knee joint: Secondary | ICD-10-CM | POA: Diagnosis present

## 2019-12-15 DIAGNOSIS — M48061 Spinal stenosis, lumbar region without neurogenic claudication: Secondary | ICD-10-CM | POA: Insufficient documentation

## 2019-12-15 DIAGNOSIS — Z951 Presence of aortocoronary bypass graft: Secondary | ICD-10-CM | POA: Insufficient documentation

## 2019-12-15 DIAGNOSIS — Z79899 Other long term (current) drug therapy: Secondary | ICD-10-CM | POA: Diagnosis not present

## 2019-12-15 DIAGNOSIS — Z87442 Personal history of urinary calculi: Secondary | ICD-10-CM | POA: Diagnosis not present

## 2019-12-15 DIAGNOSIS — Z95810 Presence of automatic (implantable) cardiac defibrillator: Secondary | ICD-10-CM | POA: Insufficient documentation

## 2019-12-15 DIAGNOSIS — Z888 Allergy status to other drugs, medicaments and biological substances status: Secondary | ICD-10-CM | POA: Insufficient documentation

## 2019-12-15 HISTORY — PX: TOTAL KNEE ARTHROPLASTY: SHX125

## 2019-12-15 LAB — CBC WITH DIFFERENTIAL/PLATELET
Abs Immature Granulocytes: 0.02 10*3/uL (ref 0.00–0.07)
Basophils Absolute: 0 10*3/uL (ref 0.0–0.1)
Basophils Relative: 1 %
Eosinophils Absolute: 0.3 10*3/uL (ref 0.0–0.5)
Eosinophils Relative: 4 %
HCT: 35.6 % — ABNORMAL LOW (ref 39.0–52.0)
Hemoglobin: 11.8 g/dL — ABNORMAL LOW (ref 13.0–17.0)
Immature Granulocytes: 0 %
Lymphocytes Relative: 34 %
Lymphs Abs: 2.1 10*3/uL (ref 0.7–4.0)
MCH: 29.6 pg (ref 26.0–34.0)
MCHC: 33.1 g/dL (ref 30.0–36.0)
MCV: 89.4 fL (ref 80.0–100.0)
Monocytes Absolute: 0.5 10*3/uL (ref 0.1–1.0)
Monocytes Relative: 8 %
Neutro Abs: 3.3 10*3/uL (ref 1.7–7.7)
Neutrophils Relative %: 53 %
Platelets: 123 10*3/uL — ABNORMAL LOW (ref 150–400)
RBC: 3.98 MIL/uL — ABNORMAL LOW (ref 4.22–5.81)
RDW: 13 % (ref 11.5–15.5)
WBC: 6.2 10*3/uL (ref 4.0–10.5)
nRBC: 0 % (ref 0.0–0.2)

## 2019-12-15 LAB — COMPREHENSIVE METABOLIC PANEL
ALT: 16 U/L (ref 0–44)
AST: 21 U/L (ref 15–41)
Albumin: 4.3 g/dL (ref 3.5–5.0)
Alkaline Phosphatase: 70 U/L (ref 38–126)
Anion gap: 10 (ref 5–15)
BUN: 32 mg/dL — ABNORMAL HIGH (ref 8–23)
CO2: 24 mmol/L (ref 22–32)
Calcium: 8.8 mg/dL — ABNORMAL LOW (ref 8.9–10.3)
Chloride: 99 mmol/L (ref 98–111)
Creatinine, Ser: 1.13 mg/dL (ref 0.61–1.24)
GFR calc Af Amer: >60 mL/min (ref >60)
GFR calc non Af Amer: >60 mL/min (ref >60)
Glucose, Bld: 146 mg/dL — ABNORMAL HIGH (ref 70–99)
Potassium: 3.7 mmol/L (ref 3.5–5.1)
Sodium: 133 mmol/L — ABNORMAL LOW (ref 135–145)
Total Bilirubin: 0.9 mg/dL (ref 0.3–1.2)
Total Protein: 7.1 g/dL (ref 6.5–8.1)

## 2019-12-15 LAB — GLUCOSE, CAPILLARY
Glucose-Capillary: 147 mg/dL — ABNORMAL HIGH (ref 70–99)
Glucose-Capillary: 152 mg/dL — ABNORMAL HIGH (ref 70–99)

## 2019-12-15 SURGERY — ARTHROPLASTY, KNEE, TOTAL
Anesthesia: Spinal | Site: Knee | Laterality: Left

## 2019-12-15 MED ORDER — ACETAMINOPHEN 500 MG PO TABS
1000.0000 mg | ORAL_TABLET | Freq: Once | ORAL | Status: AC
  Administered 2019-12-15: 1000 mg via ORAL
  Filled 2019-12-15: qty 2

## 2019-12-15 MED ORDER — MIDAZOLAM HCL 2 MG/2ML IJ SOLN
1.0000 mg | Freq: Once | INTRAMUSCULAR | Status: DC
  Filled 2019-12-15: qty 2

## 2019-12-15 MED ORDER — METOCLOPRAMIDE HCL 5 MG PO TABS: 5.0000 mg | ORAL_TABLET | Freq: Three times a day (TID) | ORAL | Status: DC | PRN

## 2019-12-15 MED ORDER — GABAPENTIN 300 MG PO CAPS
300.0000 mg | ORAL_CAPSULE | Freq: Once | ORAL | Status: AC
  Administered 2019-12-15: 300 mg via ORAL
  Filled 2019-12-15: qty 1

## 2019-12-15 MED ORDER — CLONIDINE HCL (ANALGESIA) 100 MCG/ML EP SOLN
EPIDURAL | Status: DC | PRN
  Administered 2019-12-15: 50 ug

## 2019-12-15 MED ORDER — ONDANSETRON HCL 4 MG/2ML IJ SOLN: 4.0000 mg | Freq: Four times a day (QID) | INTRAMUSCULAR | Status: DC | PRN

## 2019-12-15 MED ORDER — OXYCODONE HCL 5 MG PO TABS: 5.0000 mg | ORAL_TABLET | ORAL | Status: DC | PRN

## 2019-12-15 MED ORDER — FLEET ENEMA 7-19 GM/118ML RE ENEM: 1.0000 | ENEMA | Freq: Once | RECTAL | Status: DC | PRN

## 2019-12-15 MED ORDER — CEFAZOLIN SODIUM-DEXTROSE 2-4 GM/100ML-% IV SOLN
2.0000 g | Freq: Four times a day (QID) | INTRAVENOUS | Status: AC
  Administered 2019-12-15 (×2): 2 g via INTRAVENOUS
  Filled 2019-12-15 (×2): qty 100

## 2019-12-15 MED ORDER — SODIUM CHLORIDE 0.9 % IR SOLN
Status: DC | PRN
  Administered 2019-12-15: 1000 mL

## 2019-12-15 MED ORDER — ROSUVASTATIN CALCIUM 10 MG PO TABS
10.0000 mg | ORAL_TABLET | Freq: Every day | ORAL | Status: DC
  Administered 2019-12-15 – 2019-12-16 (×2): 10 mg via ORAL
  Filled 2019-12-15 (×2): qty 1

## 2019-12-15 MED ORDER — ONDANSETRON HCL 4 MG/2ML IJ SOLN
INTRAMUSCULAR | Status: DC | PRN
  Administered 2019-12-15: 4 mg via INTRAVENOUS

## 2019-12-15 MED ORDER — SENNOSIDES-DOCUSATE SODIUM 8.6-50 MG PO TABS: 1.0000 | ORAL_TABLET | Freq: Every evening | ORAL | Status: DC | PRN

## 2019-12-15 MED ORDER — SODIUM CHLORIDE 0.9 % IV SOLN: INTRAVENOUS | Status: DC

## 2019-12-15 MED ORDER — FENTANYL CITRATE (PF) 100 MCG/2ML IJ SOLN
50.0000 ug | Freq: Once | INTRAMUSCULAR | Status: AC
  Administered 2019-12-15: 50 ug via INTRAVENOUS
  Filled 2019-12-15: qty 2

## 2019-12-15 MED ORDER — BUPIVACAINE HCL (PF) 0.25 % IJ SOLN
INTRAMUSCULAR | Status: DC | PRN
  Administered 2019-12-15: 30 mL

## 2019-12-15 MED ORDER — TRANEXAMIC ACID-NACL 1000-0.7 MG/100ML-% IV SOLN
1000.0000 mg | Freq: Once | INTRAVENOUS | Status: AC
  Administered 2019-12-15: 1000 mg via INTRAVENOUS
  Filled 2019-12-15: qty 100

## 2019-12-15 MED ORDER — PHENYLEPHRINE HCL (PRESSORS) 10 MG/ML IV SOLN
INTRAVENOUS | Status: AC
  Filled 2019-12-15: qty 1

## 2019-12-15 MED ORDER — TRAMADOL HCL 50 MG PO TABS
50.0000 mg | ORAL_TABLET | Freq: Four times a day (QID) | ORAL | Status: DC
  Administered 2019-12-15 – 2019-12-16 (×4): 50 mg via ORAL
  Filled 2019-12-15 (×4): qty 1

## 2019-12-15 MED ORDER — PROPOFOL 1000 MG/100ML IV EMUL
INTRAVENOUS | Status: AC
  Filled 2019-12-15: qty 100

## 2019-12-15 MED ORDER — DOCUSATE SODIUM 100 MG PO CAPS
100.0000 mg | ORAL_CAPSULE | Freq: Two times a day (BID) | ORAL | Status: DC
  Administered 2019-12-15 – 2019-12-16 (×2): 100 mg via ORAL
  Filled 2019-12-15 (×2): qty 1

## 2019-12-15 MED ORDER — BISACODYL 5 MG PO TBEC: 5.0000 mg | DELAYED_RELEASE_TABLET | Freq: Every day | ORAL | Status: DC | PRN

## 2019-12-15 MED ORDER — PROPOFOL 10 MG/ML IV BOLUS
INTRAVENOUS | Status: AC
  Filled 2019-12-15: qty 20

## 2019-12-15 MED ORDER — LACTATED RINGERS IV SOLN: INTRAVENOUS | Status: DC

## 2019-12-15 MED ORDER — PANTOPRAZOLE SODIUM 40 MG PO TBEC: 40.0000 mg | DELAYED_RELEASE_TABLET | Freq: Every day | ORAL | Status: DC

## 2019-12-15 MED ORDER — FUROSEMIDE 40 MG PO TABS: 40.0000 mg | ORAL_TABLET | ORAL | Status: DC

## 2019-12-15 MED ORDER — SODIUM CHLORIDE 0.9% FLUSH
INTRAVENOUS | Status: DC | PRN
  Administered 2019-12-15: 20 mL

## 2019-12-15 MED ORDER — ZOLPIDEM TARTRATE 5 MG PO TABS: 5.0000 mg | ORAL_TABLET | Freq: Every evening | ORAL | Status: DC | PRN

## 2019-12-15 MED ORDER — MENTHOL 3 MG MT LOZG: 1.0000 | LOZENGE | OROMUCOSAL | Status: DC | PRN

## 2019-12-15 MED ORDER — POVIDONE-IODINE 10 % EX SWAB
2.0000 "application " | Freq: Once | CUTANEOUS | Status: AC
  Administered 2019-12-15: 2 via TOPICAL

## 2019-12-15 MED ORDER — FENTANYL CITRATE (PF) 100 MCG/2ML IJ SOLN
INTRAMUSCULAR | Status: AC
  Filled 2019-12-15: qty 2

## 2019-12-15 MED ORDER — PANTOPRAZOLE SODIUM 40 MG PO TBEC
40.0000 mg | DELAYED_RELEASE_TABLET | Freq: Every day | ORAL | Status: DC
  Administered 2019-12-16: 40 mg via ORAL
  Filled 2019-12-15 (×2): qty 1

## 2019-12-15 MED ORDER — FENTANYL CITRATE (PF) 100 MCG/2ML IJ SOLN
INTRAMUSCULAR | Status: DC | PRN
  Administered 2019-12-15 (×4): 25 ug via INTRAVENOUS

## 2019-12-15 MED ORDER — CELECOXIB 200 MG PO CAPS
400.0000 mg | ORAL_CAPSULE | Freq: Once | ORAL | Status: AC
  Administered 2019-12-15: 400 mg via ORAL
  Filled 2019-12-15: qty 2

## 2019-12-15 MED ORDER — PHENYLEPHRINE 40 MCG/ML (10ML) SYRINGE FOR IV PUSH (FOR BLOOD PRESSURE SUPPORT)
PREFILLED_SYRINGE | INTRAVENOUS | Status: DC | PRN
  Administered 2019-12-15: 120 ug via INTRAVENOUS
  Administered 2019-12-15 (×2): 80 ug via INTRAVENOUS
  Administered 2019-12-15: 120 ug via INTRAVENOUS

## 2019-12-15 MED ORDER — ROPIVACAINE HCL 5 MG/ML IJ SOLN
INTRAMUSCULAR | Status: DC | PRN
  Administered 2019-12-15 (×2): 5 mL via PERINEURAL

## 2019-12-15 MED ORDER — GABAPENTIN 300 MG PO CAPS
300.0000 mg | ORAL_CAPSULE | Freq: Three times a day (TID) | ORAL | Status: DC
  Administered 2019-12-15 – 2019-12-16 (×4): 300 mg via ORAL
  Filled 2019-12-15 (×4): qty 1

## 2019-12-15 MED ORDER — AMLODIPINE BESYLATE 5 MG PO TABS
5.0000 mg | ORAL_TABLET | Freq: Every day | ORAL | Status: DC
  Administered 2019-12-16: 5 mg via ORAL
  Filled 2019-12-15: qty 1

## 2019-12-15 MED ORDER — HYDROMORPHONE HCL 1 MG/ML IJ SOLN: 0.5000 mg | INTRAMUSCULAR | Status: DC | PRN

## 2019-12-15 MED ORDER — LISINOPRIL 20 MG PO TABS
20.0000 mg | ORAL_TABLET | Freq: Every day | ORAL | Status: DC
  Administered 2019-12-15 – 2019-12-16 (×2): 20 mg via ORAL
  Filled 2019-12-15 (×2): qty 1

## 2019-12-15 MED ORDER — METHOCARBAMOL 500 MG IVPB - SIMPLE MED
INTRAVENOUS | Status: AC
  Filled 2019-12-15: qty 50

## 2019-12-15 MED ORDER — FUROSEMIDE 40 MG PO TABS
60.0000 mg | ORAL_TABLET | Freq: Every day | ORAL | Status: DC
  Administered 2019-12-16: 60 mg via ORAL
  Filled 2019-12-15: qty 1

## 2019-12-15 MED ORDER — WATER FOR IRRIGATION, STERILE IR SOLN
Status: DC | PRN
  Administered 2019-12-15: 2000 mL

## 2019-12-15 MED ORDER — DEXAMETHASONE SODIUM PHOSPHATE 10 MG/ML IJ SOLN
10.0000 mg | Freq: Once | INTRAMUSCULAR | Status: AC
  Administered 2019-12-16: 10 mg via INTRAVENOUS
  Filled 2019-12-15: qty 1

## 2019-12-15 MED ORDER — ONDANSETRON HCL 4 MG PO TABS: 4.0000 mg | ORAL_TABLET | Freq: Four times a day (QID) | ORAL | Status: DC | PRN

## 2019-12-15 MED ORDER — TRANEXAMIC ACID-NACL 1000-0.7 MG/100ML-% IV SOLN
1000.0000 mg | INTRAVENOUS | Status: AC
  Administered 2019-12-15: 1000 mg via INTRAVENOUS
  Filled 2019-12-15: qty 100

## 2019-12-15 MED ORDER — FUROSEMIDE 40 MG PO TABS
40.0000 mg | ORAL_TABLET | Freq: Every day | ORAL | Status: DC
  Administered 2019-12-15: 40 mg via ORAL
  Filled 2019-12-15: qty 1

## 2019-12-15 MED ORDER — METHOCARBAMOL 500 MG IVPB - SIMPLE MED
500.0000 mg | Freq: Four times a day (QID) | INTRAVENOUS | Status: DC | PRN
  Administered 2019-12-15: 500 mg via INTRAVENOUS
  Filled 2019-12-15: qty 50

## 2019-12-15 MED ORDER — BUPIVACAINE LIPOSOME 1.3 % IJ SUSP
INTRAMUSCULAR | Status: DC | PRN
  Administered 2019-12-15: 20 mL

## 2019-12-15 MED ORDER — PHENYLEPHRINE HCL-NACL 10-0.9 MG/250ML-% IV SOLN
INTRAVENOUS | Status: DC | PRN
  Administered 2019-12-15: 50 ug/min via INTRAVENOUS

## 2019-12-15 MED ORDER — TAMSULOSIN HCL 0.4 MG PO CAPS
0.4000 mg | ORAL_CAPSULE | Freq: Every day | ORAL | Status: DC
  Administered 2019-12-16: 0.4 mg via ORAL
  Filled 2019-12-15: qty 1

## 2019-12-15 MED ORDER — PROPOFOL 10 MG/ML IV BOLUS
INTRAVENOUS | Status: DC | PRN
  Administered 2019-12-15: 10 mg via INTRAVENOUS
  Administered 2019-12-15: 130 mg via INTRAVENOUS
  Administered 2019-12-15: 10 mg via INTRAVENOUS

## 2019-12-15 MED ORDER — LIDOCAINE 2% (20 MG/ML) 5 ML SYRINGE
INTRAMUSCULAR | Status: DC | PRN
  Administered 2019-12-15: 60 mg via INTRAVENOUS

## 2019-12-15 MED ORDER — SODIUM CHLORIDE (PF) 0.9 % IJ SOLN
INTRAMUSCULAR | Status: AC
  Filled 2019-12-15: qty 20

## 2019-12-15 MED ORDER — DEXAMETHASONE SODIUM PHOSPHATE 10 MG/ML IJ SOLN
8.0000 mg | Freq: Once | INTRAMUSCULAR | Status: AC
  Administered 2019-12-15: 8 mg via INTRAVENOUS

## 2019-12-15 MED ORDER — PHENOL 1.4 % MT LIQD: 1.0000 | OROMUCOSAL | Status: DC | PRN

## 2019-12-15 MED ORDER — FERROUS SULFATE 325 (65 FE) MG PO TABS
325.0000 mg | ORAL_TABLET | Freq: Three times a day (TID) | ORAL | Status: DC
  Administered 2019-12-15 – 2019-12-16 (×2): 325 mg via ORAL
  Filled 2019-12-15 (×2): qty 1

## 2019-12-15 MED ORDER — BUPIVACAINE HCL (PF) 0.25 % IJ SOLN
INTRAMUSCULAR | Status: AC
  Filled 2019-12-15: qty 30

## 2019-12-15 MED ORDER — METHOCARBAMOL 500 MG PO TABS
500.0000 mg | ORAL_TABLET | Freq: Four times a day (QID) | ORAL | Status: DC | PRN
  Administered 2019-12-15: 500 mg via ORAL
  Filled 2019-12-15: qty 1

## 2019-12-15 MED ORDER — METOCLOPRAMIDE HCL 5 MG/ML IJ SOLN: 5.0000 mg | Freq: Three times a day (TID) | INTRAMUSCULAR | Status: DC | PRN

## 2019-12-15 MED ORDER — ACETAMINOPHEN 500 MG PO TABS
1000.0000 mg | ORAL_TABLET | Freq: Four times a day (QID) | ORAL | Status: AC
  Administered 2019-12-15 – 2019-12-16 (×3): 1000 mg via ORAL
  Filled 2019-12-15 (×4): qty 2

## 2019-12-15 MED ORDER — EPHEDRINE SULFATE-NACL 50-0.9 MG/10ML-% IV SOSY
PREFILLED_SYRINGE | INTRAVENOUS | Status: DC | PRN
  Administered 2019-12-15 (×3): 10 mg via INTRAVENOUS

## 2019-12-15 MED ORDER — ALUM & MAG HYDROXIDE-SIMETH 200-200-20 MG/5ML PO SUSP: 30.0000 mL | ORAL | Status: DC | PRN

## 2019-12-15 MED ORDER — ASPIRIN EC 325 MG PO TBEC
325.0000 mg | DELAYED_RELEASE_TABLET | Freq: Two times a day (BID) | ORAL | Status: DC
  Administered 2019-12-16: 325 mg via ORAL
  Filled 2019-12-15: qty 1

## 2019-12-15 MED ORDER — TRIMETHOPRIM 100 MG PO TABS
100.0000 mg | ORAL_TABLET | Freq: Every day | ORAL | Status: DC
  Administered 2019-12-16: 100 mg via ORAL
  Filled 2019-12-15: qty 1

## 2019-12-15 MED ORDER — ROPIVACAINE HCL 7.5 MG/ML IJ SOLN
INTRAMUSCULAR | Status: DC | PRN
  Administered 2019-12-15 (×4): 5 mL via PERINEURAL

## 2019-12-15 MED ORDER — GABAPENTIN 600 MG PO TABS: 600.0000 mg | ORAL_TABLET | Freq: Two times a day (BID) | ORAL | Status: DC

## 2019-12-15 MED ORDER — CLINDAMYCIN PHOSPHATE 900 MG/50ML IV SOLN
900.0000 mg | INTRAVENOUS | Status: AC
  Administered 2019-12-15: 900 mg via INTRAVENOUS
  Filled 2019-12-15: qty 50

## 2019-12-15 MED ORDER — DIPHENHYDRAMINE HCL 12.5 MG/5ML PO ELIX: 12.5000 mg | ORAL_SOLUTION | ORAL | Status: DC | PRN

## 2019-12-15 SURGICAL SUPPLY — 53 items
ARTISURF 10M PLYL 10-11EF KNEE (Knees) ×2 IMPLANT
BAG ZIPLOCK 12X15 (MISCELLANEOUS) ×3 IMPLANT
BLADE SAGITTAL 13X1.27X60 (BLADE) ×2 IMPLANT
BLADE SAGITTAL 13X1.27X60MM (BLADE) ×1
BLADE SAW SGTL 83.5X18.5 (BLADE) ×3 IMPLANT
BLADE SURG 15 STRL LF DISP TIS (BLADE) ×1 IMPLANT
BLADE SURG 15 STRL SS (BLADE) ×2
BLADE SURG SZ10 CARB STEEL (BLADE) ×6 IMPLANT
BNDG ELASTIC 6X5.8 VLCR STR LF (GAUZE/BANDAGES/DRESSINGS) ×3 IMPLANT
CLOSURE WOUND 1/2 X4 (GAUZE/BANDAGES/DRESSINGS) ×1
COMP FEM LT PS SZ 10 (Miscellaneous) ×3 IMPLANT
COMPONENT FEM LT PS SZ 10 (Miscellaneous) IMPLANT
COVER SURGICAL LIGHT HANDLE (MISCELLANEOUS) ×3 IMPLANT
COVER WAND RF STERILE (DRAPES) ×2 IMPLANT
CUFF TOURN SGL QUICK 34 (TOURNIQUET CUFF) ×2
CUFF TRNQT CYL 34X4.125X (TOURNIQUET CUFF) ×1 IMPLANT
DECANTER SPIKE VIAL GLASS SM (MISCELLANEOUS) ×6 IMPLANT
DRAPE INCISE IOBAN 66X45 STRL (DRAPES) ×6 IMPLANT
DRAPE U-SHAPE 47X51 STRL (DRAPES) ×3 IMPLANT
DRSG AQUACEL AG ADV 3.5X10 (GAUZE/BANDAGES/DRESSINGS) ×3 IMPLANT
DURAPREP 26ML APPLICATOR (WOUND CARE) ×6 IMPLANT
ELECT REM PT RETURN 15FT ADLT (MISCELLANEOUS) ×3 IMPLANT
GLOVE BIOGEL M STRL SZ7.5 (GLOVE) ×3 IMPLANT
GLOVE BIOGEL PI IND STRL 7.5 (GLOVE) ×1 IMPLANT
GLOVE BIOGEL PI IND STRL 8.5 (GLOVE) ×2 IMPLANT
GLOVE BIOGEL PI INDICATOR 7.5 (GLOVE) ×2
GLOVE BIOGEL PI INDICATOR 8.5 (GLOVE) ×4
GLOVE SURG ORTHO 8.0 STRL STRW (GLOVE) ×9 IMPLANT
GOWN STRL REUS W/ TWL XL LVL3 (GOWN DISPOSABLE) ×2 IMPLANT
GOWN STRL REUS W/TWL XL LVL3 (GOWN DISPOSABLE) ×6
HANDPIECE INTERPULSE COAX TIP (DISPOSABLE) ×3
HOOD PEEL AWAY FLYTE STAYCOOL (MISCELLANEOUS) ×9 IMPLANT
KIT TURNOVER KIT A (KITS) ×2 IMPLANT
MANIFOLD NEPTUNE II (INSTRUMENTS) ×3 IMPLANT
NEEDLE HYPO 22GX1.5 SAFETY (NEEDLE) ×3 IMPLANT
NS IRRIG 1000ML POUR BTL (IV SOLUTION) ×3 IMPLANT
PACK TOTAL KNEE CUSTOM (KITS) ×3 IMPLANT
PATELLA STD SZ 38X10 (Miscellaneous) ×2 IMPLANT
PENCIL SMOKE EVACUATOR (MISCELLANEOUS) ×2 IMPLANT
PROTECTOR NERVE ULNAR (MISCELLANEOUS) ×3 IMPLANT
SET HNDPC FAN SPRY TIP SCT (DISPOSABLE) ×1 IMPLANT
STEM TIBIAL TRAB SZF LT (Stem) ×2 IMPLANT
STRIP CLOSURE SKIN 1/2X4 (GAUZE/BANDAGES/DRESSINGS) ×2 IMPLANT
SUT BONE WAX W31G (SUTURE) ×3 IMPLANT
SUT MNCRL AB 3-0 PS2 18 (SUTURE) ×3 IMPLANT
SUT STRATAFIX 0 PDS 27 VIOLET (SUTURE) ×3
SUT STRATAFIX PDS+ 0 24IN (SUTURE) ×3 IMPLANT
SUT VIC AB 1 CT1 36 (SUTURE) ×3 IMPLANT
SUTURE STRATFX 0 PDS 27 VIOLET (SUTURE) ×1 IMPLANT
SYR CONTROL 10ML LL (SYRINGE) ×6 IMPLANT
WATER STERILE IRR 1000ML POUR (IV SOLUTION) ×6 IMPLANT
WRAP KNEE MAXI GEL POST OP (GAUZE/BANDAGES/DRESSINGS) ×3 IMPLANT
YANKAUER SUCT BULB TIP 10FT TU (MISCELLANEOUS) ×3 IMPLANT

## 2019-12-15 NOTE — Progress Notes (Signed)
Assisted Dr. Hatchett with left, ultrasound guided, adductor canal block. Side rails up, monitors on throughout procedure. See vital signs in flow sheet. Tolerated Procedure well. 

## 2019-12-15 NOTE — Op Note (Signed)
TOTAL KNEE REPLACEMENT OPERATIVE NOTE:  12/15/2019  11:45 AM  PATIENT:  Roger Curry  85 y.o. male  PRE-OPERATIVE DIAGNOSIS:  Osteoarthritis left knee  POST-OPERATIVE DIAGNOSIS:  Osteoarthritis left knee  PROCEDURE:  Procedure(s): TOTAL KNEE ARTHROPLASTY  SURGEON:  Surgeon(s): Vickey Huger, MD  PHYSICIAN ASSISTANT:  Nehemiah Massed, PA-C  ANESTHESIA:   general  SPECIMEN: None  COUNTS:  Correct  TOURNIQUET:  * No tourniquets in log *  DICTATION:  Indication for procedure:    The patient is a 85 y.o. male who has failed conservative treatment for Osteoarthritis left knee.  Informed consent was obtained prior to anesthesia. The risks versus benefits of the operation were explain and in a way the patient can, and did, understand.     Description of procedure:     The patient was taken to the operating room and placed under anesthesia.  The patient was positioned in the usual fashion taking care that all body parts were adequately padded and/or protected.  A tourniquet was applied and the leg prepped and draped in the usual sterile fashion.  The extremity was exsanguinated with the esmarch and tourniquet inflated to 350 mmHg.  Pre-operative range of motion was normal.    A midline incision approximately 6-7 inches long was made with a #10 blade.  A new blade was used to make a parapatellar arthrotomy going 2-3 cm into the quadriceps tendon, over the patella, and alongside the medial aspect of the patellar tendon.  A synovectomy was then performed with the #10 blade and forceps. I then elevated the deep MCL off the medial tibial metaphysis subperiosteally around to the semimembranosus attachment.    I everted the patella and used calipers to measure patellar thickness.  I used the reamer to ream down to appropriate thickness to recreate the native thickness.  I then removed excess bone with the rongeur and sagittal saw.  I used the appropriately sized template and drilled the three  lug holes.  I then put the trial in place and measured the thickness with the calipers to ensure recreation of the native thickness.  The trial was then removed and the patella subluxed and the knee brought into flexion.  A homan retractor was place to retract and protect the patella and lateral structures.  A Z-retractor was place medially to protect the medial structures.  The extra-medullary alignment system was used to make cut the tibial articular surface perpendicular to the anamotic axis of the tibia and in 3 degrees of posterior slope.  The cut surface and alignment jig was removed.  I then used the intramedullary alignment guide to make a valgus cut on the distal femur.  I then marked out the epicondylar axis on the distal femur.    I then used the anterior referencing sizer and measured the femur to be a size 10.  The 4-In-1 cutting block was screwed into place in external rotation matching the posterior condylar angle, making our cuts perpendicular to the epicondylar axis.  Anterior, posterior and chamfer cuts were made with the sagittal saw.  The cutting block and cut pieces were removed.  A lamina spreader was placed in 90 degrees of flexion.  The ACL, PCL, menisci, and posterior condylar osteophytes were removed.  A 10 mm spacer blocked was found to offer good flexion and extension gap balance after minimal in degree releasing.   The scoop retractor was then placed and the femoral finishing block was pinned in place.  The small sagittal saw  was used as well as the lug drill to finish the femur.  The block and cut surfaces were removed and the medullary canal hole filled with autograft bone from the cut pieces.  The tibia was delivered forward in deep flexion and external rotation.  A size F tray was selected and pinned into place centered on the medial 1/3 of the tibial tubercle.  The reamer and keel was used to prepare the tibia through the tray.    I then trialed with the size 10 femur,  size F tibia, a 10 mm insert and the 38 patella.  I had excellent flexion/extension gap balance, excellent patella tracking.  Flexion was full and beyond 120 degrees; extension was zero.  These components were chosen and the staff opened them to me on the back table while the knee was lavaged copiously.  The soft tissue was infiltrated with 60cc of exparel 1.3% through a 21 gauge needle.  I press fit in the components.  The polyethylene tibial component was snapped into place and the knee placed in extension.  The capsule was infilltrated with a 60cc exparel/marcaine/saline mixture. Hemostasis was obtained.  The arthrotomy was closed using a #1 stratofix running suture.  The deep soft tissues were closed with #0 vicryls and the subcuticular layer closed with #2-0 vicryl.  The skin was reapproximated and closed with 3.0 Monocryl.  The wound was covered with steristrips, aquacel dressing, and a TED stocking.   The patient was then awakened, extubated, and taken to the recovery room in stable condition.  BLOOD LOSS:  300cc COMPLICATIONS:  None.  PLAN OF CARE: Admit for overnight observation  PATIENT DISPOSITION:  PACU - hemodynamically stable.   Delay start of Pharmacological VTE agent (>24hrs) due to surgical blood loss or risk of bleeding:  yes  Please fax a copy of this op note to my office at [phone] (please only include page 1 and 2 of the Case Information op note)

## 2019-12-15 NOTE — Transfer of Care (Signed)
Immediate Anesthesia Transfer of Care Note  Patient: Roger Curry  Procedure(s) Performed: TOTAL KNEE ARTHROPLASTY (Left Knee)  Patient Location: PACU  Anesthesia Type:General  Level of Consciousness: awake  Airway & Oxygen Therapy: Patient Spontanous Breathing and Patient connected to face mask oxygen  Post-op Assessment: Report given to RN and Post -op Vital signs reviewed and stable  Post vital signs: Reviewed and stable  Last Vitals:  Vitals Value Taken Time  BP 125/60 12/15/19 1027  Temp    Pulse 81 12/15/19 1029  Resp 14 12/15/19 1029  SpO2 100 % 12/15/19 1029  Vitals shown include unvalidated device data.  Last Pain:  Vitals:   12/15/19 0809  TempSrc:   PainSc: 0-No pain         Complications: No apparent anesthesia complications

## 2019-12-15 NOTE — Progress Notes (Signed)
Orthopedic Tech Progress Note Patient Details:  Roger Curry February 09, 1935 [id number]  CPM Left Knee CPM Left Knee: On Left Knee Flexion (Degrees): 90 Left Knee Extension (Degrees): 0    Ortho Devices Type of Ortho Device: CPM padding, Bone foam zero knee Ortho Device/Splint Location: LLE Ortho Device/Splint Interventions: Ordered       Roger Curry 12/15/2019, 11:13 AM

## 2019-12-15 NOTE — Evaluation (Signed)
Physical Therapy Evaluation Patient Details Name: Roger Curry MRN: [id number] DOB: 14-May-1935 Today's Date: 12/15/2019   History of Present Illness  s/p L TKA. PMH: CHF, 4 back surgeries, CAD, CKD, polyneuropathy  Clinical Impression  Pt is s/p TKA resulting in the deficits listed below (see PT Problem List).  Pt amb 77' with RW and min assist. Anticipate steady progress in acute setting   Pt will benefit from skilled PT to increase their independence and safety with mobility to allow discharge to the venue listed below.      Follow Up Recommendations Follow surgeon's recommendation for DC plan and follow-up therapies    Equipment Recommendations  None recommended by PT    Recommendations for Other Services       Precautions / Restrictions Precautions Precautions: Fall;Knee Restrictions Weight Bearing Restrictions: No Other Position/Activity Restrictions: WBAT      Mobility  Bed Mobility Overal bed mobility: Needs Assistance Bed Mobility: Supine to Sit     Supine to sit: Min guard     General bed mobility comments: for safety  Transfers Overall transfer level: Needs assistance Equipment used: Rolling walker (2 wheeled) Transfers: Sit to/from Stand Sit to Stand: Min guard;Min assist         General transfer comment: cues for hand placement  Ambulation/Gait Ambulation/Gait assistance: Min guard Gait Distance (Feet): 70 Feet Assistive device: Rolling walker (2 wheeled) Gait Pattern/deviations: Step-to pattern;Decreased stance time - left     General Gait Details: cues for sequence and RW safety  Stairs            Wheelchair Mobility    Modified Rankin (Stroke Patients Only)       Balance Overall balance assessment: Needs assistance Sitting-balance support: No upper extremity supported;Feet supported Sitting balance-Leahy Scale: Poor     Standing balance support: Bilateral upper extremity supported Standing balance-Leahy Scale:  Poor Standing balance comment: reliant on UEs                             Pertinent Vitals/Pain      Home Living Family/patient expects to be discharged to:: Private residence Living Arrangements: Children Available Help at Discharge: Family Type of Home: House Home Access: Ramped entrance     Home Layout: One level Home Equipment: Environmental consultant - 2 wheels;Cane - single point      Prior Function Level of Independence: Independent with assistive device(s);Independent         Comments: amb with 2 canes     Hand Dominance        Extremity/Trunk Assessment   Upper Extremity Assessment Upper Extremity Assessment: Overall WFL for tasks assessed    Lower Extremity Assessment Lower Extremity Assessment: LLE deficits/detail LLE Deficits / Details: ankle WFL, knee extension and hip flexion 2+/5, ~ 7 degree quad lag       Communication   Communication: No difficulties  Cognition                                              General Comments      Exercises     Assessment/Plan    PT Assessment Patient needs continued PT services  PT Problem List Decreased strength;Decreased range of motion;Decreased activity tolerance;Decreased knowledge of use of DME;Pain;Decreased mobility       PT Treatment Interventions DME instruction;Therapeutic exercise;Gait  training;Functional mobility training;Therapeutic activities;Patient/family education    PT Goals (Current goals can be found in the Care Plan section)  Acute Rehab PT Goals Patient Stated Goal: less knee pain PT Goal Formulation: With patient Time For Goal Achievement: 12/22/19 Potential to Achieve Goals: Good    Frequency 7X/week   Barriers to discharge        Co-evaluation               AM-PAC PT "6 Clicks" Mobility  Outcome Measure Help needed turning from your back to your side while in a flat bed without using bedrails?: A Little Help needed moving from lying on your  back to sitting on the side of a flat bed without using bedrails?: A Little Help needed moving to and from a bed to a chair (including a wheelchair)?: A Little Help needed standing up from a chair using your arms (e.g., wheelchair or bedside chair)?: A Little Help needed to walk in hospital room?: A Little Help needed climbing 3-5 steps with a railing? : A Lot 6 Click Score: 17    End of Session Equipment Utilized During Treatment: Gait belt Activity Tolerance: Patient tolerated treatment well Patient left: in chair;with call bell/phone within reach;with chair alarm set   PT Visit Diagnosis: Difficulty in walking, not elsewhere classified (R26.2)    Time: 2694-8546 PT Time Calculation (min) (ACUTE ONLY): 22 min   Charges:   PT Evaluation $PT Eval Low Complexity: Hitchcock, PT   Acute Rehab Dept [facility]): [phone]   12/15/2019   [facility] 12/15/2019, 2:59 PM

## 2019-12-15 NOTE — H&P (Signed)
Roger Curry MRN:  [id number] DOB/SEX:  1935-04-13/male  CHIEF COMPLAINT:  Painful left Knee  HISTORY: Patient is a 85 y.o. male presented with a history of pain in the left knee. Onset of symptoms was gradual starting a few years ago with gradually worsening course since that time. Patient has been treated conservatively with over-the-counter NSAIDs and activity modification. Patient currently rates pain in the knee at 10 out of 10 with activity. There is pain at night.  PAST MEDICAL HISTORY: Patient Active Problem List   Diagnosis Date Noted  . Mixed dyslipidemia 04/02/2017  . Cervical radiculopathy 09/28/2016  . Acute pain of right shoulder 09/28/2016  . H/O prior ablation treatment 08/15/2016  . Ischemic cardiomyopathy 10/28/2015  . Transaminitis 10/09/2015  . Systolic CHF, chronic (HCC) 10/09/2015  . Acute gallstone pancreatitis 10/05/2015  . Diabetes mellitus type 2, controlled (HCC) 10/05/2015  . Essential hypertension 10/05/2015  . CAD (coronary artery disease) 10/05/2015  . Normocytic normochromic anemia 10/05/2015  . S/P CABG (coronary artery bypass graft) 10/04/2015  . Polyneuropathy in diseases classified elsewhere (HCC) 10/04/2015  . Neurogenic bladder 10/04/2015  . Multilevel degenerative disc disease 10/04/2015  . Mitral valve disorder 10/04/2015  . Hyperlipidemia 10/04/2015  . CKD stage 3 secondary to diabetes (HCC) 10/04/2015  . Cardiomyopathy (HCC) 10/04/2015  . Benign prostatic hyperplasia 10/04/2015  . B12 deficiency 10/04/2015  . Acid reflux 10/04/2015  . Presence of biventricular AICD 03/11/2015  . Bilateral carpal tunnel syndrome 10/26/2014  . Spinal stenosis of lumbar region 01/26/2014  . Diabetic neuropathy (HCC) 01/26/2014  . Post laminectomy syndrome 01/26/2014  . Urinary calculus 11/10/2013  . Displacement of lumbar intervertebral disc 11/10/2013  . DDD (degenerative disc disease), lumbosacral 11/10/2013  . Congestive heart failure (HCC)  11/10/2013   Past Medical History:  Diagnosis Date  . Arthritis   . Atrial flutter [facility])    ? s/p ablation at [facility].  references in Ct reprt to variant pullmonary venous anatomy and pending pulmonary venous ablation.in 04/2013  . Bladder dysfunction    self  caths 4 x daily.   Marland Kitchen CAD (coronary artery disease)   . CHF (congestive heart failure) (HCC)    EF on echo 09/2015 35 to 40%.   . Closed angle glaucoma   . Electrocution 1970s  . Renal stones   . Spinal stenosis    lumbar and cervical.   . Type 2 diabetes mellitus (HCC)    Past Surgical History:  Procedure Laterality Date  . BALLOON ANGIOPLASTY, ARTERY  1992  . BILATERAL HEMI SHOULDER ARTHROPLASTY Left   . CHOLECYSTECTOMY N/A 10/08/2015   Procedure: LAPAROSCOPIC CHOLECYSTECTOMY;  Surgeon: Violeta Gelinas, MD;  Location: [facility] OR;  Service: General;  Laterality: N/A;  . CORONARY ARTERY BYPASS GRAFT  1996  . CYSTOSCOPY    . ERCP N/A 10/06/2015   Procedure: ENDOSCOPIC RETROGRADE CHOLANGIOPANCREATOGRAPHY (ERCP);  Surgeon: Rachael Fee, MD;  Location: [facility] [place] ENDOSCOPY;  Service: Endoscopy;  Laterality: N/A;  . ESOPHAGOGASTRODUODENOSCOPY  2014   in [place]  . GLAUCOMA SURGERY    . INSERTION OF ICD  2014  . LUMBAR SPINE SURGERY  1997, 2009   1997 l spine fusion.  2009 lumbar laminectomy, discectomy, fusion.  says he has had total of 4 surgeries.     MEDICATIONS:   Medications Prior to Admission  Medication Sig Dispense Refill Last Dose  . amLODipine (NORVASC) 5 MG tablet Take 5 mg by mouth daily.   12/15/2019 at Unknown time  . aspirin EC 81  MG tablet Take 81 mg by mouth daily.   Past Week at Unknown time  . cyanocobalamin (,VITAMIN B-12,) 1000 MCG/ML injection Inject 1,000 mcg into the muscle every 30 (thirty) days.   Past Week at Unknown time  . furosemide (LASIX) 40 MG tablet Take 40-60 mg by mouth See admin instructions. 60 mg in the morning, and 40 mg in the evening   12/14/2019 at Unknown time  . gabapentin (NEURONTIN) 600 MG  tablet Take 600 mg by mouth 2 (two) times daily.    12/15/2019 at Unknown time  . lisinopril (ZESTRIL) 20 MG tablet Take 20 mg by mouth daily.    12/14/2019 at Unknown time  . Omega-3 Fatty Acids (FISH OIL) 1200 MG CAPS Take 1 capsule by mouth 2 (two) times daily.   Past Week at Unknown time  . omeprazole (PRILOSEC) 20 MG capsule Take 20 mg by mouth daily.   12/15/2019 at Unknown time  . rosuvastatin (CRESTOR) 20 MG tablet Take 10 mg by mouth daily.   12/14/2019 at Unknown time  . tamsulosin (FLOMAX) 0.4 MG CAPS capsule Take 0.4 mg by mouth daily.   12/15/2019 at Unknown time  . trimethoprim (TRIMPEX) 100 MG tablet Take 100 mg by mouth daily.    12/15/2019 at Unknown time  . TRULICITY 5.00 XF/8.1WE SOPN Inject 0.75 mg into the skin every Monday.    12/14/2019 at Unknown time    ALLERGIES:   Allergies  Allergen Reactions  . Penicillins Hives and Itching    Childhood allergy  . Quinine Derivatives Hives and Itching    REVIEW OF SYSTEMS:  A comprehensive review of systems was negative except for: Musculoskeletal: positive for arthralgias and bone pain   FAMILY HISTORY:   Family History  Problem Relation Age of Onset  . Stroke Brother   . Hypertension Brother     SOCIAL HISTORY:   Social History   Tobacco Use  . Smoking status: Former Research scientist (life sciences)  . Smokeless tobacco: Never Used  Substance Use Topics  . Alcohol use: No     EXAMINATION:  Vital signs in last 24 hours: Temp:  [97.9 F (36.6 C)] 97.9 F (36.6 C) (05/03 0617) Pulse Rate:  [60] 60 (05/03 0617) Resp:  [18] 18 (05/03 0617) BP: (150)/(62) 150/62 (05/03 0617) SpO2:  [97 %] 97 % (05/03 0617) Weight:  [84.6 kg] 84.6 kg (05/03 0641)  BP (!) 150/62   Pulse 60   Temp 97.9 F (36.6 C) (Oral)   Resp 18   Wt 84.6 kg   SpO2 97%   BMI 30.10 kg/m   General Appearance:    Alert, cooperative, no distress, appears stated age  Head:    Normocephalic, without obvious abnormality, atraumatic  Eyes:    PERRL, conjunctiva/corneas clear,  EOM's intact, fundi    benign, both eyes       Ears:    Normal TM's and external ear canals, both ears  Nose:   Nares normal, septum midline, mucosa normal, no drainage    or sinus tenderness  Throat:   Lips, mucosa, and tongue normal; teeth and gums normal  Neck:   Supple, symmetrical, trachea midline, no adenopathy;       thyroid:  No enlargement/tenderness/nodules; no carotid   bruit or JVD  Back:     Symmetric, no curvature, ROM normal, no CVA tenderness  Lungs:     Clear to auscultation bilaterally, respirations unlabored  Chest wall:    No tenderness or deformity  Heart:  Regular rate and rhythm, S1 and S2 normal, no murmur, rub   or gallop  Abdomen:     Soft, non-tender, bowel sounds active all four quadrants,    no masses, no organomegaly  Genitalia:    Normal male without lesion, discharge or tenderness  Rectal:    Normal tone, normal prostate, no masses or tenderness;   guaiac negative stool  Extremities:   Extremities normal, atraumatic, no cyanosis or edema  Pulses:   2+ and symmetric all extremities  Skin:   Skin color, texture, turgor normal, no rashes or lesions  Lymph nodes:   Cervical, supraclavicular, and axillary nodes normal  Neurologic:   CNII-XII intact. Normal strength, sensation and reflexes      throughout    Musculoskeletal:  ROM 0-120, Ligaments intact,  Imaging Review Plain radiographs demonstrate severe degenerative joint disease of the left knee. The overall alignment is neutral. The bone quality appears to be good for age and reported activity level.  Assessment/Plan: Primary osteoarthritis, left knee   The patient history, physical examination and imaging studies are consistent with advanced degenerative joint disease of the left knee. The patient has failed conservative treatment.  The clearance notes were reviewed.  After discussion with the patient it was felt that Total Knee Replacement was indicated. The procedure,  risks, and benefits of total  knee arthroplasty were presented and reviewed. The risks including but not limited to aseptic loosening, infection, blood clots, vascular injury, stiffness, patella tracking problems complications among others were discussed. The patient acknowledged the explanation, agreed to proceed with the plan.  Preoperative templating of the joint replacement has been completed, documented, and submitted to the Operating Room personnel in order to optimize intra-operative equipment management.    Patient's anticipated LOS is less than 2 midnights, meeting these requirements: - Lives within 1 hour of care - Has a competent adult at home to recover with post-op recover - NO history of  - Chronic pain requiring opiods  - Diabetes  - Coronary Artery Disease  - Heart failure  - Heart attack  - Stroke  - DVT/VTE  - Cardiac arrhythmia  - Respiratory Failure/COPD  - Renal failure  - Anemia  - Advanced Liver disease       Guy Sandifer 12/15/2019, 7:04 AM

## 2019-12-15 NOTE — Anesthesia Postprocedure Evaluation (Signed)
Anesthesia Post Note  Patient: Roger Curry  Procedure(s) Performed: TOTAL KNEE ARTHROPLASTY (Left Knee)     Patient location during evaluation: PACU Anesthesia Type: General Level of consciousness: sedated and awake Pain management: pain level controlled Vital Signs Assessment: post-procedure vital signs reviewed and stable Respiratory status: spontaneous breathing Cardiovascular status: stable Postop Assessment: no apparent nausea or vomiting    Last Vitals:  Vitals:   12/15/19 1115 12/15/19 1130  BP: (!) 110/51 (!) 106/52  Pulse: 72 72  Resp: 15 17  Temp: 36.4 C   SpO2: 97% 98%    Last Pain:  Vitals:   12/15/19 1130  TempSrc:   PainSc: 0-No pain   Pain Goal:                   Caren Macadam

## 2019-12-15 NOTE — Anesthesia Procedure Notes (Signed)
Anesthesia Regional Block: Adductor canal block   Pre-Anesthetic Checklist: ,, timeout performed, Correct Patient, Correct Site, Correct Laterality, Correct Procedure, Correct Position, site marked, Risks and benefits discussed,  Surgical consent,  Pre-op evaluation,  At surgeon's request and post-op pain management  Laterality: Lower and Left  Prep: chloraprep       Needles:  Injection technique: Single-shot  Needle Type: Echogenic Stimulator Needle     Needle Length: 10cm  Needle Gauge: 21   Needle insertion depth: 2 cm   Additional Needles:   Procedures:,,,, ultrasound used (permanent image in chart),,,,  Narrative:  Start time: 12/15/2019 8:00 AM End time: 12/15/2019 8:10 AM Injection made incrementally with aspirations every 5 mL.  Performed by: Personally  Anesthesiologist: Leilani Able, MD

## 2019-12-15 NOTE — Progress Notes (Signed)
Orthopedic Tech Progress Note Patient Details:  Roger Curry Feb 23, 1935 [id number] Patient taken out at 1450 Patient ID: Curt Jews, male   DOB: [birth year], 85 y.o.   MRN: [id number]   Ancil Linsey 12/15/2019, 2:58 PM

## 2019-12-15 NOTE — Anesthesia Procedure Notes (Signed)
Procedure Name: LMA Insertion Date/Time: 12/15/2019 8:37 AM Performed by: Nelle Don, CRNA Pre-anesthesia Checklist: Patient identified, Emergency Drugs available, Suction available and Patient being monitored Patient Re-evaluated:Patient Re-evaluated prior to induction Oxygen Delivery Method: Circle system utilized Preoxygenation: Pre-oxygenation with 100% oxygen Induction Type: IV induction LMA: LMA inserted LMA Size: 4.0 Dental Injury: Teeth and Oropharynx as per pre-operative assessment

## 2019-12-16 DIAGNOSIS — M1712 Unilateral primary osteoarthritis, left knee: Secondary | ICD-10-CM | POA: Diagnosis not present

## 2019-12-16 LAB — CBC
HCT: 30.4 % — ABNORMAL LOW (ref 39.0–52.0)
Hemoglobin: 10.4 g/dL — ABNORMAL LOW (ref 13.0–17.0)
MCH: 30.6 pg (ref 26.0–34.0)
MCHC: 34.2 g/dL (ref 30.0–36.0)
MCV: 89.4 fL (ref 80.0–100.0)
Platelets: 126 10*3/uL — ABNORMAL LOW (ref 150–400)
RBC: 3.4 MIL/uL — ABNORMAL LOW (ref 4.22–5.81)
RDW: 13 % (ref 11.5–15.5)
WBC: 8.9 10*3/uL (ref 4.0–10.5)
nRBC: 0 % (ref 0.0–0.2)

## 2019-12-16 LAB — BASIC METABOLIC PANEL
Anion gap: 8 (ref 5–15)
BUN: 28 mg/dL — ABNORMAL HIGH (ref 8–23)
CO2: 23 mmol/L (ref 22–32)
Calcium: 8.2 mg/dL — ABNORMAL LOW (ref 8.9–10.3)
Chloride: 101 mmol/L (ref 98–111)
Creatinine, Ser: 1.07 mg/dL (ref 0.61–1.24)
GFR calc Af Amer: >60 mL/min (ref >60)
GFR calc non Af Amer: >60 mL/min (ref >60)
Glucose, Bld: 186 mg/dL — ABNORMAL HIGH (ref 70–99)
Potassium: 4 mmol/L (ref 3.5–5.1)
Sodium: 132 mmol/L — ABNORMAL LOW (ref 135–145)

## 2019-12-16 MED ORDER — OXYCODONE HCL 5 MG PO TABS: 5.0000 mg | ORAL_TABLET | ORAL | 0 refills | Status: DC | PRN

## 2019-12-16 MED ORDER — METHOCARBAMOL 500 MG PO TABS: 500.0000 mg | ORAL_TABLET | Freq: Four times a day (QID) | ORAL | 0 refills | Status: DC | PRN

## 2019-12-16 MED ORDER — ASPIRIN 325 MG PO TBEC: 325.0000 mg | DELAYED_RELEASE_TABLET | Freq: Two times a day (BID) | ORAL | 0 refills | Status: DC

## 2019-12-16 NOTE — Progress Notes (Signed)
Patient wore bone foam for 35 minutes.

## 2019-12-16 NOTE — Progress Notes (Signed)
RN reviewed discharge instructions with patient and family. All questions answered.   Paperwork and prescriptions given.   NT rolled patient in wheelchair to family car.     S[place]ittemore, Charity fundraiser

## 2019-12-16 NOTE — TOC Transition Note (Signed)
Transition of Care [facility]) - CM/SW Discharge Note   Patient Details  Name: Roger Curry MRN: [id number] Date of Birth: 1935-04-22  [facility] of [facility]) CM/SW Contact:  Lennart Pall, LCSW Phone Number: 12/16/2019, 12:05 PM   Clinical Narrative:  Met briefly with pt to confirm he has needed DME and is aware [facility] to provide HHPT.  No needs.     Final next level of care: Home w Home Health Services Barriers to Discharge: No Barriers Identified   Patient Goals and CMS Choice        Discharge Placement                       Discharge Plan and Services                DME Arranged: N/A(already has needed DME) DME Agency: NA       HH Arranged: PT HH Agency: [facility] ([facility])(pre-arranged via MD office)        Social Determinants of Health (SDOH) Interventions     Readmission Risk Interventions No flowsheet data found.

## 2019-12-16 NOTE — Progress Notes (Signed)
   12/16/19 1300  PT Visit Information  Last PT Received On 12/16/19 Progression well. Ready for d/c from PT standpoint. Recommend initial supervision for mobility  Assistance Needed +1  History of Present Illness s/p L TKA. PMH: CHF, 4 back surgeries, CAD, CKD, polyneuropathy  Subjective Data  Patient Stated Goal less knee pain  Precautions  Precautions Fall;Knee  Restrictions  Weight Bearing Restrictions No  Other Position/Activity Restrictions WBAT  Pain Assessment  Pain Assessment 0-10  Pain Score 0  Cognition  Arousal/Alertness Awake/alert  Behavior During Therapy WFL for tasks assessed/performed  Overall Cognitive Status Within Functional Limits for tasks assessed  Bed Mobility  General bed mobility comments in chair   Transfers  Overall transfer level Needs assistance  Equipment used Rolling walker (2 wheeled)  Transfers Sit to/from Stand  Sit to Stand Supervision  General transfer comment cues for hand placement  Ambulation/Gait  Ambulation/Gait assistance Min guard  Gait Distance (Feet) 120 Feet  Assistive device Rolling walker (2 wheeled)  Gait Pattern/deviations Step-to pattern;Decreased stance time - left  General Gait Details cues for sequence and RW safety  Balance  Standing balance support Bilateral upper extremity supported  Standing balance-Leahy Scale Poor  Standing balance comment reliant on UEs  Total Joint Exercises                       PT - End of Session  Equipment Utilized During Treatment Gait belt  Activity Tolerance Patient tolerated treatment well  Patient left in chair;with call bell/phone within reach;with chair alarm set   PT - Assessment/Plan  PT Plan Current plan remains appropriate  PT Visit Diagnosis Difficulty in walking, not elsewhere classified (R26.2)  PT Frequency (ACUTE ONLY) 7X/week  Follow Up Recommendations Follow surgeon's recommendation for DC plan and follow-up therapies  PT equipment None recommended by PT   AM-PAC PT "6 Clicks" Mobility Outcome Measure (Version 2)  Help needed turning from your back to your side while in a flat bed without using bedrails? 3  Help needed moving from lying on your back to sitting on the side of a flat bed without using bedrails? 3  Help needed moving to and from a bed to a chair (including a wheelchair)? 3  Help needed standing up from a chair using your arms (e.g., wheelchair or bedside chair)? 3  Help needed to walk in hospital room? 3  Help needed climbing 3-5 steps with a railing?  3  6 Click Score 18  Consider Recommendation of Discharge To: Home with [facility]  PT Goal Progression  Progress towards PT goals Progressing toward goals  Acute Rehab PT Goals  PT Goal Formulation With patient  Time For Goal Achievement 12/22/19  Potential to Achieve Goals Good  PT Time Calculation  PT Start Time (ACUTE ONLY) 1255  PT Stop Time (ACUTE ONLY) 1309  PT Time Calculation (min) (ACUTE ONLY) 14 min  PT General Charges  $$ ACUTE PT VISIT 1 Visit  PT Treatments  $Gait Training 8-22 mins

## 2019-12-16 NOTE — Progress Notes (Addendum)
Physical Therapy Treatment Patient Details Name: Roger Curry MRN: [id number] DOB: 09-18-1934 Today's Date: 12/16/2019    History of Present Illness s/p L TKA. PMH: CHF, 4 back surgeries, CAD, CKD, polyneuropathy    PT Comments    Pt progressing well today. Pain well controlled.  Will see for a second session and pt should be ready for d/c later today   Follow Up Recommendations  Follow surgeon's recommendation for DC plan and follow-up therapies     Equipment Recommendations  None recommended by PT    Recommendations for Other Services       Precautions / Restrictions Precautions Precautions: Fall;Knee Restrictions Weight Bearing Restrictions: No Other Position/Activity Restrictions: WBAT    Mobility  Bed Mobility Overal bed mobility: Needs Assistance Bed Mobility: Supine to Sit     Supine to sit: Supervision     General bed mobility comments: for safety  Transfers Overall transfer level: Needs assistance Equipment used: Rolling walker (2 wheeled) Transfers: Sit to/from Stand Sit to Stand: Supervision         General transfer comment: cues for hand placement  Ambulation/Gait Ambulation/Gait assistance: Min guard Gait Distance (Feet): 100 Feet Assistive device: Rolling walker (2 wheeled) Gait Pattern/deviations: Step-to pattern;Decreased stance time - left     General Gait Details: cues for sequence and RW safety   Stairs             Wheelchair Mobility    Modified Rankin (Stroke Patients Only)       Balance           Standing balance support: Bilateral upper extremity supported Standing balance-Leahy Scale: Poor Standing balance comment: reliant on UEs                            Cognition Arousal/Alertness: Awake/alert Behavior During Therapy: WFL for tasks assessed/performed Overall Cognitive Status: Within Functional Limits for tasks assessed                                        Exercises     Ankle Circles/Pumps AROM;Both;10 reps  Quad Sets AROM;Both;10 reps  Heel Slides AAROM;AROM;Left;10 reps  Hip ABduction/ADduction AROM;Left;10 reps  Straight Leg Raises AAROM;AROM;Left;10 reps  Short Arc Quad AROM;Left;10 reps  LAQs x 10 LLE  Goniometric ROM ~ 5 to 70 degree left knee flexion      General Comments        Pertinent Vitals/Pain Pain Assessment: 0-10 Pain Score: 0-No pain Pain Descriptors / Indicators: Aching Pain Intervention(s): Limited activity within patient's tolerance;Monitored during session;Premedicated before session    Home Living                      Prior Function            PT Goals (current goals can now be found in the care plan section) Acute Rehab PT Goals Patient Stated Goal: less knee pain PT Goal Formulation: With patient Time For Goal Achievement: 12/22/19 Potential to Achieve Goals: Good Progress towards PT goals: Progressing toward goals    Frequency    7X/week      PT Plan Current plan remains appropriate    Co-evaluation              AM-PAC PT "6 Clicks" Mobility   Outcome Measure  Help needed turning from your back to  your side while in a flat bed without using bedrails?: A Little Help needed moving from lying on your back to sitting on the side of a flat bed without using bedrails?: A Little Help needed moving to and from a bed to a chair (including a wheelchair)?: A Little Help needed standing up from a chair using your arms (e.g., wheelchair or bedside chair)?: A Little Help needed to walk in hospital room?: A Little Help needed climbing 3-5 steps with a railing? : A Little 6 Click Score: 18    End of Session Equipment Utilized During Treatment: Gait belt Activity Tolerance: Patient tolerated treatment well Patient left: in chair;with call bell/phone within reach;with chair alarm set   PT Visit Diagnosis: Difficulty in walking, not elsewhere classified (R26.2)     Time: 2595-6387 PT Time  Calculation (min) (ACUTE ONLY): 24 min  Charges:  $Gait Training: 8-22 mins $Therapeutic Exercise: 8-22 mins                     Baxter Flattery, PT   Acute Rehab Dept [facility]): [phone]   12/16/2019    [facility] 12/16/2019, 10:27 AM

## 2019-12-16 NOTE — Progress Notes (Signed)
SPORTS MEDICINE AND JOINT REPLACEMENT  Lara Mulch, MD    Carlyon Shadow, PA-C [place], [place], [place]  [id number]                             [phone]   PROGRESS NOTE  Subjective:  negative for Chest Pain  negative for Shortness of Breath  negative for Nausea/Vomiting   negative for Calf Pain  negative for Bowel Movement   Tolerating Diet: yes         Patient reports pain as 3 on 0-10 scale.    Objective: Vital signs in last 24 hours:    Patient Vitals for the past 24 hrs:  BP Temp Temp src Pulse Resp SpO2 Height  12/16/19 0657 (!) 124/57 97.6 F (36.4 C) Oral 72 16 98 % --  12/16/19 0214 134/62 (!) 97.5 F (36.4 C) Oral 70 16 96 % --  12/15/19 2327 (!) 140/54 97.7 F (36.5 C) Oral 72 18 99 % --  12/15/19 1809 120/67 98.2 F (36.8 C) Oral 73 16 100 % --  12/15/19 1453 113/70 -- -- 77 17 100 % --  12/15/19 1349 (!) 144/66 97.9 F (36.6 C) Oral 77 17 100 % --  12/15/19 1250 (!) 122/58 -- -- 69 16 100 % --  12/15/19 1150 -- -- -- -- -- -- 5\' 6"  (1.676 m)  12/15/19 1146 (!) 107/54 97.6 F (36.4 C) Oral 73 16 95 % --  12/15/19 1130 (!) 106/52 -- -- 72 17 98 % --  12/15/19 1115 (!) 110/51 97.6 F (36.4 C) -- 72 15 97 % --  12/15/19 1100 (!) 106/46 -- -- 79 16 99 % --  12/15/19 1045 (!) 115/50 -- -- 79 15 100 % --  12/15/19 1030 (!) 125/49 -- -- 79 13 100 % --  12/15/19 1028 125/60 97.6 F (36.4 C) -- 80 17 100 % --  12/15/19 0807 -- -- -- 72 -- 100 % --  12/15/19 0806 -- -- -- 80 -- 100 % --  12/15/19 0805 -- -- -- 71 -- 100 % --  12/15/19 0804 131/65 -- -- 73 -- 99 % --  12/15/19 0803 -- -- -- 67 -- 99 % --  12/15/19 0802 -- -- -- 61 -- 99 % --  12/15/19 0801 -- -- -- 66 -- 99 % --  12/15/19 0800 -- -- -- 64 -- 100 % --  12/15/19 0759 -- -- -- 67 -- 100 % --  12/15/19 0758 -- -- -- 73 -- 100 % --  12/15/19 0757 -- -- -- 74 -- 100 % --  12/15/19 0756 -- -- -- 72 -- 100 % --  12/15/19 0755 -- -- -- 69 -- 100 % --  12/15/19 0754 -- -- -- 70 --  100 % --  12/15/19 0753 127/61 -- -- 68 -- 99 % --  12/15/19 0752 -- -- -- 61 -- 100 % --  12/15/19 0751 -- -- -- 66 -- 100 % --  12/15/19 0750 -- -- -- 67 -- 99 % --  12/15/19 0749 (!) 129/56 -- -- 64 -- 99 % --  12/15/19 0748 -- -- -- 66 -- 99 % --  12/15/19 0747 -- -- -- 64 -- 99 % --    @flow {1959:LAST@   Intake/Output from previous day:   05/03 0701 - 05/04 0700 In: 3183.1 [P.O.:540; I.V.:2243.1] Out: 3025 [Urine:2925]   Intake/Output this shift:  No intake/output data recorded.   Intake/Output      05/03 0701 - 05/04 0700 05/04 0701 - 05/05 0700   P.O. 540    I.V. (mL/kg) 2243.1 (26.5)    IV Piggyback 400    Total Intake(mL/kg) 3183.1 (37.6)    Urine (mL/kg/hr) 2925 (1.4)    Blood 100    Total Output 3025    Net +158.1            LABORATORY DATA: Recent Labs    12/09/19 1040 12/15/19 0631 12/16/19 0258  WBC 8.4 6.2 8.9  HGB 12.8* 11.8* 10.4*  HCT 39.1 35.6* 30.4*  PLT 134* 123* 126*   Recent Labs    12/09/19 1040 12/15/19 0631 12/16/19 0258  NA 138 133* 132*  K 5.0 3.7 4.0  CL 99 99 101  CO2 28 24 23   BUN 27* 32* 28*  CREATININE 1.06 1.13 1.07  GLUCOSE 160* 146* 186*  CALCIUM 9.3 8.8* 8.2*   Lab Results  Component Value Date   INR 1.06 10/05/2015   INR 1.0 06/03/2008    Examination:  General appearance: alert, cooperative and no distress Extremities: extremities normal, atraumatic, no cyanosis or edema  Wound Exam: clean, dry, intact   Drainage:  None: wound tissue dry  Motor Exam: Quadriceps and Hamstrings Intact  Sensory Exam: Superficial Peroneal, Deep Peroneal and Tibial normal   Assessment:    1 Day Post-Op  Procedure(s) (LRB): TOTAL KNEE ARTHROPLASTY (Left)  ADDITIONAL DIAGNOSIS:  Active Problems:   S/P total knee replacement     Plan: Physical Therapy as ordered Weight Bearing as Tolerated (WBAT)  DVT Prophylaxis:  Aspirin  DISCHARGE PLAN: Home   Patient doing well, expect discharge home today       Patient's anticipated LOS is less than 2 midnights, meeting these requirements: - Lives within 1 hour of care - Has a competent adult at home to recover with post-op recover - NO history of  - Chronic pain requiring opiods  - Diabetes  - Coronary Artery Disease  - Heart failure  - Heart attack  - Stroke  - DVT/VTE  - Cardiac arrhythmia  - Respiratory Failure/COPD  - Renal failure  - Anemia  - Advanced Liver disease        06/05/2008 12/16/2019, 7:07 AM

## 2019-12-16 NOTE — Discharge Summary (Signed)
SPORTS MEDICINE & JOINT REPLACEMENT   Georgena Spurling, MD   Laurier Nancy, PA-C [address] [place], [place], [place]  [address]                             [phone]  PATIENT ID: Roger Curry        MRN:  [id number]          DOB/AGE: 03-17-1935 / 85 y.o.    DISCHARGE SUMMARY  ADMISSION DATE:    12/15/2019 DISCHARGE DATE:   12/16/2019   ADMISSION DIAGNOSIS: S/P total knee replacement [Z96.659]    DISCHARGE DIAGNOSIS:  Osteoarthritis left knee    ADDITIONAL DIAGNOSIS: Active Problems:   S/P total knee replacement  Past Medical History:  Diagnosis Date  . Arthritis   . Atrial flutter [facility])    ? s/p ablation at [facility].  references in Ct reprt to variant pullmonary venous anatomy and pending pulmonary venous ablation.in 04/2013  . Bladder dysfunction    self  caths 4 x daily.   Marland Kitchen CAD (coronary artery disease)   . CHF (congestive heart failure) (HCC)    EF on echo 09/2015 35 to 40%.   . Closed angle glaucoma   . Electrocution 1970s  . Renal stones   . Spinal stenosis    lumbar and cervical.   . Type 2 diabetes mellitus (HCC)     PROCEDURE: Procedure(s): TOTAL KNEE ARTHROPLASTY on 12/15/2019  CONSULTS:    HISTORY:  See H&P in chart  HOSPITAL COURSE:  ALEXANDRE FARIES is a 85 y.o. admitted on 12/15/2019 and found to have a diagnosis of Osteoarthritis left knee.  After appropriate laboratory studies were obtained  they were taken to the operating room on 12/15/2019 and underwent Procedure(s): TOTAL KNEE ARTHROPLASTY.   They were given perioperative antibiotics:  Anti-infectives (From admission, onward)   Start     Dose/Rate Route Frequency Ordered Stop   12/16/19 1000  trimethoprim (TRIMPEX) tablet 100 mg     100 mg Oral Daily 12/15/19 1149     12/15/19 1230  ceFAZolin (ANCEF) IVPB 2g/100 mL premix     2 g 200 mL/hr over 30 Minutes Intravenous Every 6 hours 12/15/19 1149 12/15/19 1834   12/15/19 0645  clindamycin (CLEOCIN) IVPB 900 mg     900 mg 100 mL/hr over 30 Minutes  Intravenous On call to O.R. 12/15/19 0630 12/15/19 0857    .  Patient given tranexamic acid IV or topical and exparel intra-operatively.  Tolerated the procedure well.    POD# 1: Vital signs were stable.  Patient denied Chest pain, shortness of breath, or calf pain.  Patient was started on Aspirin twice daily at 8am.  Consults to PT, OT, and care management were made.  The patient was weight bearing as tolerated.  CPM was placed on the operative leg 0-90 degrees for 6-8 hours a day. When out of the CPM, patient was placed in the foam block to achieve full extension. Incentive spirometry was taught.  Dressing was changed.       POD #2, Continued  PT for ambulation and exercise program.  IV saline locked.  O2 discontinued.    The remainder of the hospital course was dedicated to ambulation and strengthening.   The patient was discharged on 1 Day Post-Op in  Good condition.  Blood products given:none  DIAGNOSTIC STUDIES: Recent vital signs:  Patient Vitals for the past 24 hrs:  BP Temp Temp  src Pulse Resp SpO2 Height  12/16/19 0657 (!) 124/57 97.6 F (36.4 C) Oral 72 16 98 % --  12/16/19 0214 134/62 (!) 97.5 F (36.4 C) Oral 70 16 96 % --  12/15/19 2327 (!) 140/54 97.7 F (36.5 C) Oral 72 18 99 % --  12/15/19 1809 120/67 98.2 F (36.8 C) Oral 73 16 100 % --  12/15/19 1453 113/70 -- -- 77 17 100 % --  12/15/19 1349 (!) 144/66 97.9 F (36.6 C) Oral 77 17 100 % --  12/15/19 1250 (!) 122/58 -- -- 69 16 100 % --  12/15/19 1150 -- -- -- -- -- --  (1.676 m)  12/15/19 1146 (!) 107/54 97.6 F (36.4 C) Oral 73 16 95 % --  12/15/19 1130 (!) 106/52 -- -- 72 17 98 % --  12/15/19 1115 (!) 110/51 97.6 F (36.4 C) -- 72 15 97 % --  12/15/19 1100 (!) 106/46 -- -- 79 16 99 % --  12/15/19 1045 (!) 115/50 -- -- 79 15 100 % --  12/15/19 1030 (!) 125/49 -- -- 79 13 100 % --  12/15/19 1028 125/60 97.6 F (36.4 C) -- 80 17 100 % --  12/15/19 0807 -- -- -- 72 -- 100 % --  12/15/19 0806 -- --  -- 80 -- 100 % --  12/15/19 0805 -- -- -- 71 -- 100 % --  12/15/19 0804 131/65 -- -- 73 -- 99 % --  12/15/19 0803 -- -- -- 67 -- 99 % --  12/15/19 0802 -- -- -- 61 -- 99 % --  12/15/19 0801 -- -- -- 66 -- 99 % --  12/15/19 0800 -- -- -- 64 -- 100 % --  12/15/19 0759 -- -- -- 67 -- 100 % --  12/15/19 0758 -- -- -- 73 -- 100 % --  12/15/19 0757 -- -- -- 74 -- 100 % --  12/15/19 0756 -- -- -- 72 -- 100 % --  12/15/19 0755 -- -- -- 69 -- 100 % --  12/15/19 0754 -- -- -- 70 -- 100 % --  12/15/19 0753 127/61 -- -- 68 -- 99 % --  12/15/19 0752 -- -- -- 61 -- 100 % --  12/15/19 0751 -- -- -- 66 -- 100 % --  12/15/19 0750 -- -- -- 67 -- 99 % --  12/15/19 0749 (!) 129/56 -- -- 64 -- 99 % --  12/15/19 0748 -- -- -- 66 -- 99 % --  12/15/19 0747 -- -- -- 64 -- 99 % --       Recent laboratory studies: Recent Labs    12/09/19 1040 12/15/19 0631 12/16/19 0258  WBC 8.4 6.2 8.9  HGB 12.8* 11.8* 10.4*  HCT 39.1 35.6* 30.4*  PLT 134* 123* 126*   Recent Labs    12/09/19 1040 12/15/19 0631 12/16/19 0258  NA 138 133* 132*  K 5.0 3.7 4.0  CL 99 99 101  CO2 BUN 27* 32* 28*  CREATININE 1.06 1.13 1.07  GLUCOSE 160* 146* 186*  CALCIUM 9.3 8.8* 8.2*   Lab Results  Component Value Date   INR 1.06 10/05/2015   INR 1.0 06/03/2008     Recent Radiographic Studies :  No results found.  DISCHARGE INSTRUCTIONS: Discharge Instructions    Call MD / Call 911   Complete by: As directed    If you experience chest pain or shortness of breath, CALL 911 and be transported to  the hospital emergency room.  If you develope a fever above 101 F, pus (white drainage) or increased drainage or redness at the wound, or calf pain, call your surgeon's office.   Constipation Prevention   Complete by: As directed    Drink plenty of fluids.  Prune juice may be helpful.  You may use a stool softener, such as Colace (over the counter) 100 mg twice a day.  Use MiraLax (over the counter) for constipation as  needed.   Diet - low sodium heart healthy   Complete by: As directed    Discharge instructions   Complete by: As directed    INSTRUCTIONS AFTER JOINT REPLACEMENT   Remove items at home which could result in a fall. This includes throw rugs or furniture in walking pathways ICE to the affected joint every three hours while awake for 30 minutes at a time, for at least the first 3-5 days, and then as needed for pain and swelling.  Continue to use ice for pain and swelling. You may notice swelling that will progress down to the foot and ankle.  This is normal after surgery.  Elevate your leg when you are not up walking on it.   Continue to use the breathing machine you got in the hospital (incentive spirometer) which will help keep your temperature down.  It is common for your temperature to cycle up and down following surgery, especially at night when you are not up moving around and exerting yourself.  The breathing machine keeps your lungs expanded and your temperature down.   DIET:  As you were doing prior to hospitalization, we recommend a well-balanced diet.  DRESSING / WOUND CARE / SHOWERING  Keep the surgical dressing until follow up.  The dressing is water proof, so you can shower without any extra covering.  IF THE DRESSING FALLS OFF or the wound gets wet inside, change the dressing with sterile gauze.  Please use good hand washing techniques before changing the dressing.  Do not use any lotions or creams on the incision until instructed by your surgeon.    ACTIVITY  Increase activity slowly as tolerated, but follow the weight bearing instructions below.   No driving for 6 weeks or until further direction given by your physician.  You cannot drive while taking narcotics.  No lifting or carrying greater than 10 lbs. until further directed by your surgeon. Avoid periods of inactivity such as sitting longer than an hour when not asleep. This helps prevent blood clots.  You may return to  work once you are authorized by your doctor.     WEIGHT BEARING   Weight bearing as tolerated with assist device (walker, cane, etc) as directed, use it as long as suggested by your surgeon or therapist, typically at least 4-6 weeks.   EXERCISES  Results after joint replacement surgery are often greatly improved when you follow the exercise, range of motion and muscle strengthening exercises prescribed by your doctor. Safety measures are also important to protect the joint from further injury. Any time any of these exercises cause you to have increased pain or swelling, decrease what you are doing until you are comfortable again and then slowly increase them. If you have problems or questions, call your caregiver or physical therapist for advice.   Rehabilitation is important following a joint replacement. After just a few days of immobilization, the muscles of the leg can become weakened and shrink (atrophy).  These exercises are designed to build up  the tone and strength of the thigh and leg muscles and to improve motion. Often times heat used for twenty to thirty minutes before working out will loosen up your tissues and help with improving the range of motion but do not use heat for the first two weeks following surgery (sometimes heat can increase post-operative swelling).   These exercises can be done on a training (exercise) mat, on the floor, on a table or on a bed. Use whatever works the best and is most comfortable for you.    Use music or television while you are exercising so that the exercises are a pleasant break in your day. This will make your life better with the exercises acting as a break in your routine that you can look forward to.   Perform all exercises about fifteen times, three times per day or as directed.  You should exercise both the operative leg and the other leg as well.   Exercises include:   Quad Sets - Tighten up the muscle on the front of the thigh (Quad) and  hold for 5-10 seconds.   Straight Leg Raises - With your knee straight (if you were given a brace, keep it on), lift the leg to 60 degrees, hold for 3 seconds, and slowly lower the leg.  Perform this exercise against resistance later as your leg gets stronger.  Leg Slides: Lying on your back, slowly slide your foot toward your buttocks, bending your knee up off the floor (only go as far as is comfortable). Then slowly slide your foot back down until your leg is flat on the floor again.  Angel Wings: Lying on your back spread your legs to the side as far apart as you can without causing discomfort.  Hamstring Strength:  Lying on your back, push your heel against the floor with your leg straight by tightening up the muscles of your buttocks.  Repeat, but this time bend your knee to a comfortable angle, and push your heel against the floor.  You may put a pillow under the heel to make it more comfortable if necessary.   A rehabilitation program following joint replacement surgery can speed recovery and prevent re-injury in the future due to weakened muscles. Contact your doctor or a physical therapist for more information on knee rehabilitation.    CONSTIPATION  Constipation is defined medically as fewer than three stools per week and severe constipation as less than one stool per week.  Even if you have a regular bowel pattern at home, your normal regimen is likely to be disrupted due to multiple reasons following surgery.  Combination of anesthesia, postoperative narcotics, change in appetite and fluid intake all can affect your bowels.   YOU MUST use at least one of the following options; they are listed in order of increasing strength to get the job done.  They are all available over the counter, and you may need to use some, POSSIBLY even all of these options:    Drink plenty of fluids (prune juice may be helpful) and high fiber foods Colace 100 mg by mouth twice a day  Senokot for constipation as  directed and as needed Dulcolax (bisacodyl), take with full glass of water  Miralax (polyethylene glycol) once or twice a day as needed.  If you have tried all these things and are unable to have a bowel movement in the first 3-4 days after surgery call either your surgeon or your primary doctor.    If you  experience loose stools or diarrhea, hold the medications until you stool forms back up.  If your symptoms do not get better within 1 week or if they get worse, check with your doctor.  If you experience "the worst abdominal pain ever" or develop nausea or vomiting, please contact the office immediately for further recommendations for treatment.   ITCHING:  If you experience itching with your medications, try taking only a single pain pill, or even half a pain pill at a time.  You can also use Benadryl over the counter for itching or also to help with sleep.   TED HOSE STOCKINGS:  Use stockings on both legs until for at least 2 weeks or as directed by physician office. They may be removed at night for sleeping.  MEDICATIONS:  See your medication summary on the "After Visit Summary" that nursing will review with you.  You may have some home medications which will be placed on hold until you complete the course of blood thinner medication.  It is important for you to complete the blood thinner medication as prescribed.  PRECAUTIONS:  If you experience chest pain or shortness of breath - call 911 immediately for transfer to the hospital emergency department.   If you develop a fever greater that 101 F, purulent drainage from wound, increased redness or drainage from wound, foul odor from the wound/dressing, or calf pain - CONTACT YOUR SURGEON.                                                   FOLLOW-UP APPOINTMENTS:  If you do not already have a post-op appointment, please call the office for an appointment to be seen by your surgeon.  Guidelines for how soon to be seen are listed in your "After  Visit Summary", but are typically between 1-4 weeks after surgery.  OTHER INSTRUCTIONS:   Knee Replacement:  Do not place pillow under knee, focus on keeping the knee straight while resting. CPM instructions: 0-90 degrees, 2 hours in the morning, 2 hours in the afternoon, and 2 hours in the evening. Place foam block, curve side up under heel at all times except when in CPM or when walking.  DO NOT modify, tear, cut, or change the foam block in any way.   DENTAL ANTIBIOTICS:  In most cases prophylactic antibiotics for Dental procdeures after total joint surgery are not necessary.  Exceptions are as follows:  1. History of prior total joint infection  2. Severely immunocompromised (Organ Transplant, cancer chemotherapy, Rheumatoid biologic meds such as Humera)  3. Poorly controlled diabetes (A1C &gt; 8.0, blood glucose over 200)  If you have one of these conditions, contact your surgeon for an antibiotic prescription, prior to your dental procedure.   MAKE SURE YOU:  Understand these instructions.  Get help right away if you are not doing well or get worse.    Thank you for letting us be a part of your medical care team.  It is a privilege we respect greatly.  We hope these instructions will help you stay on track for a fast and full recovery!   Increase activity slowly as tolerated   Complete by: As directed       DISCHARGE MEDICATIONS:   Allergies as of 12/16/2019      Reactions   Penicillins Hives, Itching   Childhood  allergy   Quinine Derivatives Hives, Itching      Medication List    TAKE these medications   amLODipine 5 MG tablet Commonly known as: NORVASC Take 5 mg by mouth daily.   aspirin 325 MG EC tablet Take 1 tablet (325 mg total) by mouth 2 (two) times daily. What changed:   medication strength  how much to take  when to take this   cyanocobalamin 1000 MCG/ML injection Commonly known as: (VITAMIN B-12) Inject 1,000 mcg into the muscle every 30  (thirty) days.   Fish Oil 1200 MG Caps Take 1 capsule by mouth 2 (two) times daily.   furosemide 40 MG tablet Commonly known as: LASIX Take 40-60 mg by mouth See admin instructions. 60 mg in the morning, and 40 mg in the evening   gabapentin 600 MG tablet Commonly known as: NEURONTIN Take 600 mg by mouth 2 (two) times daily.   lisinopril 20 MG tablet Commonly known as: ZESTRIL Take 20 mg by mouth daily.   methocarbamol 500 MG tablet Commonly known as: ROBAXIN Take 1-2 tablets (500-1,000 mg total) by mouth every 6 (six) hours as needed for muscle spasms.   omeprazole 20 MG capsule Commonly known as: PRILOSEC Take 20 mg by mouth daily.   oxyCODONE 5 MG immediate release tablet Commonly known as: Oxy IR/ROXICODONE Take 1 tablet (5 mg total) by mouth every 4 (four) hours as needed for moderate pain (pain score 4-6).   rosuvastatin 20 MG tablet Commonly known as: CRESTOR Take 10 mg by mouth daily.   tamsulosin 0.4 MG Caps capsule Commonly known as: FLOMAX Take 0.4 mg by mouth daily.   trimethoprim 100 MG tablet Commonly known as: TRIMPEX Take 100 mg by mouth daily.   Trulicity 0.35 KK/9.3GH Sopn Generic drug: Dulaglutide Inject 0.75 mg into the skin every Monday.            Durable Medical Equipment  (From admission, onward)         Start     Ordered   12/15/19 1150  DME Walker rolling  Once    Question:  Patient needs a walker to treat with the following condition  Answer:  S/P total knee replacement   12/15/19 1149   12/15/19 1150  DME 3 n 1  Once     12/15/19 1149   12/15/19 1150  DME Bedside commode  Once    Question:  Patient needs a bedside commode to treat with the following condition  Answer:  S/P total knee replacement   12/15/19 1149          FOLLOW UP VISIT:    DISPOSITION: HOME VS. SNF  CONDITION:  Good   Donia Ast 12/16/2019, 7:11 AM

## 2019-12-21 ENCOUNTER — Encounter: Payer: Self-pay | Admitting: *Deleted

## 2020-01-13 ENCOUNTER — Emergency Department (HOSPITAL_COMMUNITY)
Admission: EM | Admit: 2020-01-13 | Discharge: 2020-01-14 | Disposition: A | Discharge status: Home / Self Care | Payer: Medicare HMO | Attending: Emergency Medicine | Admitting: Emergency Medicine

## 2020-01-13 ENCOUNTER — Encounter (HOSPITAL_COMMUNITY): Payer: Self-pay

## 2020-01-13 ENCOUNTER — Other Ambulatory Visit: Payer: Self-pay

## 2020-01-13 DIAGNOSIS — Z951 Presence of aortocoronary bypass graft: Secondary | ICD-10-CM | POA: Diagnosis not present

## 2020-01-13 DIAGNOSIS — M792 Neuralgia and neuritis, unspecified: Secondary | ICD-10-CM | POA: Diagnosis not present

## 2020-01-13 DIAGNOSIS — N183 Chronic kidney disease, stage 3 unspecified: Secondary | ICD-10-CM | POA: Insufficient documentation

## 2020-01-13 DIAGNOSIS — Z79899 Other long term (current) drug therapy: Secondary | ICD-10-CM | POA: Diagnosis not present

## 2020-01-13 DIAGNOSIS — I2510 Atherosclerotic heart disease of native coronary artery without angina pectoris: Secondary | ICD-10-CM | POA: Diagnosis not present

## 2020-01-13 DIAGNOSIS — I5022 Chronic systolic (congestive) heart failure: Secondary | ICD-10-CM | POA: Insufficient documentation

## 2020-01-13 DIAGNOSIS — I130 Hypertensive heart and chronic kidney disease with heart failure and stage 1 through stage 4 chronic kidney disease, or unspecified chronic kidney disease: Secondary | ICD-10-CM | POA: Diagnosis not present

## 2020-01-13 DIAGNOSIS — M79605 Pain in left leg: Secondary | ICD-10-CM | POA: Diagnosis not present

## 2020-01-13 DIAGNOSIS — Z87891 Personal history of nicotine dependence: Secondary | ICD-10-CM | POA: Diagnosis not present

## 2020-01-13 DIAGNOSIS — Z96652 Presence of left artificial knee joint: Secondary | ICD-10-CM | POA: Insufficient documentation

## 2020-01-13 DIAGNOSIS — Z95811 Presence of heart assist device: Secondary | ICD-10-CM | POA: Diagnosis not present

## 2020-01-13 DIAGNOSIS — Z9889 Other specified postprocedural states: Secondary | ICD-10-CM | POA: Insufficient documentation

## 2020-01-13 DIAGNOSIS — E1122 Type 2 diabetes mellitus with diabetic chronic kidney disease: Secondary | ICD-10-CM | POA: Diagnosis not present

## 2020-01-13 NOTE — ED Triage Notes (Addendum)
Patient arrived by EMS for ongoing left leg pain since knee surgery on 5/3. Patient has stimulator that has been adjusted with no relief. Denies any recent injury. Patient sent for increased pain. Knee swollen but no redness nor warmth. Alert and oriented. Patient states that he was sent for pain management and only thing that relieves his pain is morphine
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
## 2020-01-14 ENCOUNTER — Emergency Department (HOSPITAL_COMMUNITY): Payer: Medicare HMO

## 2020-01-14 LAB — CBC WITH DIFFERENTIAL/PLATELET
Abs Immature Granulocytes: 0.03 10*3/uL (ref 0.00–0.07)
Basophils Absolute: 0 10*3/uL (ref 0.0–0.1)
Basophils Relative: 1 %
Eosinophils Absolute: 0.3 10*3/uL (ref 0.0–0.5)
Eosinophils Relative: 4 %
HCT: 38.9 % — ABNORMAL LOW (ref 39.0–52.0)
Hemoglobin: 12.7 g/dL — ABNORMAL LOW (ref 13.0–17.0)
Immature Granulocytes: 0 %
Lymphocytes Relative: 41 %
Lymphs Abs: 3.4 10*3/uL (ref 0.7–4.0)
MCH: 30.1 pg (ref 26.0–34.0)
MCHC: 32.6 g/dL (ref 30.0–36.0)
MCV: 92.2 fL (ref 80.0–100.0)
Monocytes Absolute: 0.6 10*3/uL (ref 0.1–1.0)
Monocytes Relative: 7 %
Neutro Abs: 3.9 10*3/uL (ref 1.7–7.7)
Neutrophils Relative %: 47 %
Platelets: 180 10*3/uL (ref 150–400)
RBC: 4.22 MIL/uL (ref 4.22–5.81)
RDW: 14.3 % (ref 11.5–15.5)
WBC: 8.3 10*3/uL (ref 4.0–10.5)
nRBC: 0 % (ref 0.0–0.2)

## 2020-01-14 LAB — BASIC METABOLIC PANEL
Anion gap: 17 — ABNORMAL HIGH (ref 5–15)
BUN: 30 mg/dL — ABNORMAL HIGH (ref 8–23)
CO2: 21 mmol/L — ABNORMAL LOW (ref 22–32)
Calcium: 9.4 mg/dL (ref 8.9–10.3)
Chloride: 97 mmol/L — ABNORMAL LOW (ref 98–111)
Creatinine, Ser: 1.17 mg/dL (ref 0.61–1.24)
GFR calc Af Amer: >60 mL/min (ref >60)
GFR calc non Af Amer: 58 mL/min — ABNORMAL LOW (ref >60)
Glucose, Bld: 144 mg/dL — ABNORMAL HIGH (ref 70–99)
Potassium: 4 mmol/L (ref 3.5–5.1)
Sodium: 135 mmol/L (ref 135–145)

## 2020-01-14 MED ORDER — MORPHINE SULFATE 15 MG PO TABS: 15.0000 mg | ORAL_TABLET | Freq: Four times a day (QID) | ORAL | 0 refills | Status: DC | PRN

## 2020-01-14 MED ORDER — MORPHINE SULFATE (PF) 4 MG/ML IV SOLN
4.0000 mg | Freq: Once | INTRAVENOUS | Status: AC
  Administered 2020-01-14: 4 mg via INTRAVENOUS
  Filled 2020-01-14: qty 1

## 2020-01-14 MED ORDER — ONDANSETRON HCL 4 MG/2ML IJ SOLN
4.0000 mg | Freq: Once | INTRAMUSCULAR | Status: AC
  Administered 2020-01-14: 4 mg via INTRAVENOUS
  Filled 2020-01-14: qty 2

## 2020-01-14 NOTE — Discharge Instructions (Signed)
Take morphine as prescribed, do not combine with any other pain medications.  Call first thing tomorrow morning to discuss with Dr. Norville Haggard severe pain after stimulator was turned up today.  You should also discuss with him options for chronic pain management.

## 2020-01-14 NOTE — ED Notes (Signed)
Patient verbalizes understanding of discharge instructions. Opportunity for questioning and answers were provided. Armband removed by staff, pt discharged from ED via wheelchair to go home with daughter.  

## 2020-04-08 ENCOUNTER — Telehealth: Payer: Self-pay

## 2020-04-08 ENCOUNTER — Other Ambulatory Visit: Payer: Self-pay | Admitting: Orthopaedic Surgery

## 2020-04-08 DIAGNOSIS — M546 Pain in thoracic spine: Secondary | ICD-10-CM

## 2020-04-08 DIAGNOSIS — M4326 Fusion of spine, lumbar region: Secondary | ICD-10-CM

## 2020-04-08 DIAGNOSIS — M47892 Other spondylosis, cervical region: Secondary | ICD-10-CM

## 2020-04-08 NOTE — Telephone Encounter (Signed)
Spoke with patient to review his medications and drug allergies before he is scheduled for a total myelogram.  He stated an understanding that he will be here two hours, will need a driver, will need to be on strict bedrest (explained) and will not need to hold any medications for this procedure.

## 2020-04-16 ENCOUNTER — Ambulatory Visit
Admission: RE | Admit: 2020-04-16 | Discharge: 2020-04-16 | Disposition: A | Discharge status: Home / Self Care | Payer: Medicare HMO | Source: Ambulatory Visit | Attending: Orthopaedic Surgery | Admitting: Orthopaedic Surgery

## 2020-04-16 ENCOUNTER — Other Ambulatory Visit: Payer: Self-pay

## 2020-04-16 DIAGNOSIS — M546 Pain in thoracic spine: Secondary | ICD-10-CM

## 2020-04-16 DIAGNOSIS — M47892 Other spondylosis, cervical region: Secondary | ICD-10-CM

## 2020-04-16 DIAGNOSIS — M4326 Fusion of spine, lumbar region: Secondary | ICD-10-CM

## 2020-04-16 MED ORDER — ONDANSETRON HCL 4 MG/2ML IJ SOLN
4.0000 mg | Freq: Once | INTRAMUSCULAR | Status: AC
  Administered 2020-04-16: 4 mg via INTRAMUSCULAR

## 2020-04-16 MED ORDER — IOPAMIDOL (ISOVUE-M 300) INJECTION 61%
10.0000 mL | Freq: Once | INTRAMUSCULAR | Status: AC | PRN
  Administered 2020-04-16: 10 mL via INTRATHECAL

## 2020-04-16 MED ORDER — MEPERIDINE HCL 50 MG/ML IJ SOLN
50.0000 mg | Freq: Once | INTRAMUSCULAR | Status: AC
  Administered 2020-04-16: 50 mg via INTRAMUSCULAR

## 2020-04-16 NOTE — Discharge Instructions (Signed)

## 2021-08-10 ENCOUNTER — Inpatient Hospital Stay (HOSPITAL_COMMUNITY): Payer: Medicare HMO

## 2021-08-10 ENCOUNTER — Emergency Department (HOSPITAL_COMMUNITY): Payer: Medicare HMO

## 2021-08-10 ENCOUNTER — Inpatient Hospital Stay (HOSPITAL_COMMUNITY)
Admission: EM | Admit: 2021-08-10 | Discharge: 2021-08-12 | DRG: 062 | Disposition: A | Discharge status: Home Health | Payer: Medicare HMO | Attending: Neurology | Admitting: Neurology

## 2021-08-10 ENCOUNTER — Other Ambulatory Visit: Payer: Self-pay

## 2021-08-10 DIAGNOSIS — H409 Unspecified glaucoma: Secondary | ICD-10-CM | POA: Diagnosis present

## 2021-08-10 DIAGNOSIS — I420 Dilated cardiomyopathy: Secondary | ICD-10-CM | POA: Diagnosis present

## 2021-08-10 DIAGNOSIS — I2510 Atherosclerotic heart disease of native coronary artery without angina pectoris: Secondary | ICD-10-CM | POA: Diagnosis present

## 2021-08-10 DIAGNOSIS — Z955 Presence of coronary angioplasty implant and graft: Secondary | ICD-10-CM

## 2021-08-10 DIAGNOSIS — I130 Hypertensive heart and chronic kidney disease with heart failure and stage 1 through stage 4 chronic kidney disease, or unspecified chronic kidney disease: Secondary | ICD-10-CM | POA: Diagnosis present

## 2021-08-10 DIAGNOSIS — G8194 Hemiplegia, unspecified affecting left nondominant side: Secondary | ICD-10-CM | POA: Diagnosis present

## 2021-08-10 DIAGNOSIS — Z79899 Other long term (current) drug therapy: Secondary | ICD-10-CM

## 2021-08-10 DIAGNOSIS — I6389 Other cerebral infarction: Secondary | ICD-10-CM | POA: Diagnosis not present

## 2021-08-10 DIAGNOSIS — Z20822 Contact with and (suspected) exposure to covid-19: Secondary | ICD-10-CM | POA: Diagnosis present

## 2021-08-10 DIAGNOSIS — I4892 Unspecified atrial flutter: Secondary | ICD-10-CM | POA: Diagnosis present

## 2021-08-10 DIAGNOSIS — Z95810 Presence of automatic (implantable) cardiac defibrillator: Secondary | ICD-10-CM | POA: Diagnosis not present

## 2021-08-10 DIAGNOSIS — Z8249 Family history of ischemic heart disease and other diseases of the circulatory system: Secondary | ICD-10-CM

## 2021-08-10 DIAGNOSIS — R471 Dysarthria and anarthria: Secondary | ICD-10-CM | POA: Diagnosis present

## 2021-08-10 DIAGNOSIS — Z951 Presence of aortocoronary bypass graft: Secondary | ICD-10-CM | POA: Diagnosis not present

## 2021-08-10 DIAGNOSIS — I255 Ischemic cardiomyopathy: Secondary | ICD-10-CM | POA: Diagnosis present

## 2021-08-10 DIAGNOSIS — N318 Other neuromuscular dysfunction of bladder: Secondary | ICD-10-CM

## 2021-08-10 DIAGNOSIS — I6319 Cerebral infarction due to embolism of other precerebral artery: Secondary | ICD-10-CM | POA: Diagnosis not present

## 2021-08-10 DIAGNOSIS — N183 Chronic kidney disease, stage 3 unspecified: Secondary | ICD-10-CM | POA: Diagnosis present

## 2021-08-10 DIAGNOSIS — I639 Cerebral infarction, unspecified: Secondary | ICD-10-CM | POA: Diagnosis present

## 2021-08-10 DIAGNOSIS — Z7982 Long term (current) use of aspirin: Secondary | ICD-10-CM | POA: Diagnosis not present

## 2021-08-10 DIAGNOSIS — I480 Paroxysmal atrial fibrillation: Secondary | ICD-10-CM | POA: Diagnosis present

## 2021-08-10 DIAGNOSIS — Z823 Family history of stroke: Secondary | ICD-10-CM

## 2021-08-10 DIAGNOSIS — N319 Neuromuscular dysfunction of bladder, unspecified: Secondary | ICD-10-CM | POA: Diagnosis present

## 2021-08-10 DIAGNOSIS — R0989 Other specified symptoms and signs involving the circulatory and respiratory systems: Secondary | ICD-10-CM

## 2021-08-10 DIAGNOSIS — Z87891 Personal history of nicotine dependence: Secondary | ICD-10-CM

## 2021-08-10 DIAGNOSIS — I5022 Chronic systolic (congestive) heart failure: Secondary | ICD-10-CM | POA: Diagnosis present

## 2021-08-10 DIAGNOSIS — Z9282 Status post administration of tPA (rtPA) in a different facility within the last 24 hours prior to admission to current facility: Secondary | ICD-10-CM | POA: Diagnosis not present

## 2021-08-10 DIAGNOSIS — R339 Retention of urine, unspecified: Secondary | ICD-10-CM | POA: Diagnosis present

## 2021-08-10 DIAGNOSIS — R4701 Aphasia: Secondary | ICD-10-CM | POA: Diagnosis present

## 2021-08-10 DIAGNOSIS — Z880 Allergy status to penicillin: Secondary | ICD-10-CM

## 2021-08-10 DIAGNOSIS — Z888 Allergy status to other drugs, medicaments and biological substances status: Secondary | ICD-10-CM | POA: Diagnosis not present

## 2021-08-10 DIAGNOSIS — I252 Old myocardial infarction: Secondary | ICD-10-CM

## 2021-08-10 DIAGNOSIS — I5021 Acute systolic (congestive) heart failure: Secondary | ICD-10-CM | POA: Diagnosis not present

## 2021-08-10 DIAGNOSIS — E1122 Type 2 diabetes mellitus with diabetic chronic kidney disease: Secondary | ICD-10-CM | POA: Diagnosis present

## 2021-08-10 DIAGNOSIS — I6340 Cerebral infarction due to embolism of unspecified cerebral artery: Principal | ICD-10-CM | POA: Diagnosis present

## 2021-08-10 DIAGNOSIS — N35919 Unspecified urethral stricture, male, unspecified site: Secondary | ICD-10-CM | POA: Diagnosis present

## 2021-08-10 DIAGNOSIS — E7800 Pure hypercholesterolemia, unspecified: Secondary | ICD-10-CM | POA: Diagnosis not present

## 2021-08-10 DIAGNOSIS — Z7985 Long-term (current) use of injectable non-insulin antidiabetic drugs: Secondary | ICD-10-CM

## 2021-08-10 DIAGNOSIS — Z96612 Presence of left artificial shoulder joint: Secondary | ICD-10-CM | POA: Diagnosis present

## 2021-08-10 DIAGNOSIS — I10 Essential (primary) hypertension: Secondary | ICD-10-CM | POA: Diagnosis not present

## 2021-08-10 DIAGNOSIS — E782 Mixed hyperlipidemia: Secondary | ICD-10-CM | POA: Diagnosis present

## 2021-08-10 DIAGNOSIS — Z96652 Presence of left artificial knee joint: Secondary | ICD-10-CM | POA: Diagnosis present

## 2021-08-10 DIAGNOSIS — R29711 NIHSS score 11: Secondary | ICD-10-CM | POA: Diagnosis present

## 2021-08-10 DIAGNOSIS — F8081 Childhood onset fluency disorder: Secondary | ICD-10-CM

## 2021-08-10 DIAGNOSIS — E1142 Type 2 diabetes mellitus with diabetic polyneuropathy: Secondary | ICD-10-CM | POA: Diagnosis present

## 2021-08-10 DIAGNOSIS — I25810 Atherosclerosis of coronary artery bypass graft(s) without angina pectoris: Secondary | ICD-10-CM | POA: Diagnosis not present

## 2021-08-10 DIAGNOSIS — K219 Gastro-esophageal reflux disease without esophagitis: Secondary | ICD-10-CM | POA: Diagnosis present

## 2021-08-10 DIAGNOSIS — Z981 Arthrodesis status: Secondary | ICD-10-CM

## 2021-08-10 DIAGNOSIS — E1169 Type 2 diabetes mellitus with other specified complication: Secondary | ICD-10-CM | POA: Diagnosis not present

## 2021-08-10 LAB — CBC
HCT: 34.8 % — ABNORMAL LOW (ref 39.0–52.0)
Hemoglobin: 10.8 g/dL — ABNORMAL LOW (ref 13.0–17.0)
MCH: 25.4 pg — ABNORMAL LOW (ref 26.0–34.0)
MCHC: 31 g/dL (ref 30.0–36.0)
MCV: 81.9 fL (ref 80.0–100.0)
Platelets: 167 10*3/uL (ref 150–400)
RBC: 4.25 MIL/uL (ref 4.22–5.81)
RDW: 17.2 % — ABNORMAL HIGH (ref 11.5–15.5)
WBC: 9.3 10*3/uL (ref 4.0–10.5)
nRBC: 0 % (ref 0.0–0.2)

## 2021-08-10 LAB — COMPREHENSIVE METABOLIC PANEL
ALT: 18 U/L (ref 0–44)
AST: 21 U/L (ref 15–41)
Albumin: 3.8 g/dL (ref 3.5–5.0)
Alkaline Phosphatase: 98 U/L (ref 38–126)
Anion gap: 10 (ref 5–15)
BUN: 21 mg/dL (ref 8–23)
CO2: 22 mmol/L (ref 22–32)
Calcium: 8.8 mg/dL — ABNORMAL LOW (ref 8.9–10.3)
Chloride: 99 mmol/L (ref 98–111)
Creatinine, Ser: 1.04 mg/dL (ref 0.61–1.24)
GFR, Estimated: >60 mL/min (ref >60)
Glucose, Bld: 147 mg/dL — ABNORMAL HIGH (ref 70–99)
Potassium: 4.5 mmol/L (ref 3.5–5.1)
Sodium: 131 mmol/L — ABNORMAL LOW (ref 135–145)
Total Bilirubin: 0.8 mg/dL (ref 0.3–1.2)
Total Protein: 7.5 g/dL (ref 6.5–8.1)

## 2021-08-10 LAB — RESP PANEL BY RT-PCR (FLU A&B, COVID) ARPGX2
Influenza A by PCR: NEGATIVE
Influenza B by PCR: NEGATIVE
SARS Coronavirus 2 by RT PCR: NEGATIVE

## 2021-08-10 LAB — TROPONIN I (HIGH SENSITIVITY)
Troponin I (High Sensitivity): 15 ng/L (ref <18)
Troponin I (High Sensitivity): 18 ng/L — ABNORMAL HIGH (ref <18)

## 2021-08-10 LAB — URINALYSIS, ROUTINE W REFLEX MICROSCOPIC
Bacteria, UA: NONE SEEN
Bilirubin Urine: NEGATIVE
Glucose, UA: NEGATIVE mg/dL
Hgb urine dipstick: NEGATIVE
Ketones, ur: NEGATIVE mg/dL
Leukocytes,Ua: NEGATIVE
Nitrite: NEGATIVE
Protein, ur: 100 mg/dL — AB
Specific Gravity, Urine: 1.015 (ref 1.005–1.030)
pH: 7 (ref 5.0–8.0)

## 2021-08-10 LAB — DIFFERENTIAL
Abs Immature Granulocytes: 0.04 10*3/uL (ref 0.00–0.07)
Basophils Absolute: 0 10*3/uL (ref 0.0–0.1)
Basophils Relative: 0 %
Eosinophils Absolute: 0.3 10*3/uL (ref 0.0–0.5)
Eosinophils Relative: 3 %
Immature Granulocytes: 0 %
Lymphocytes Relative: 20 %
Lymphs Abs: 1.8 10*3/uL (ref 0.7–4.0)
Monocytes Absolute: 0.6 10*3/uL (ref 0.1–1.0)
Monocytes Relative: 6 %
Neutro Abs: 6.6 10*3/uL (ref 1.7–7.7)
Neutrophils Relative %: 71 %

## 2021-08-10 LAB — CBG MONITORING, ED
Glucose-Capillary: 153 mg/dL — ABNORMAL HIGH (ref 70–99)
Glucose-Capillary: 133 mg/dL — ABNORMAL HIGH (ref 70–99)
Glucose-Capillary: 115 mg/dL — ABNORMAL HIGH (ref 70–99)
Glucose-Capillary: 103 mg/dL — ABNORMAL HIGH (ref 70–99)

## 2021-08-10 LAB — I-STAT CHEM 8, ED
BUN: 24 mg/dL — ABNORMAL HIGH (ref 8–23)
Calcium, Ion: 1.12 mmol/L — ABNORMAL LOW (ref 1.15–1.40)
Chloride: 100 mmol/L (ref 98–111)
Creatinine, Ser: 0.9 mg/dL (ref 0.61–1.24)
Glucose, Bld: 141 mg/dL — ABNORMAL HIGH (ref 70–99)
HCT: 37 % — ABNORMAL LOW (ref 39.0–52.0)
Hemoglobin: 12.6 g/dL — ABNORMAL LOW (ref 13.0–17.0)
Potassium: 4.6 mmol/L (ref 3.5–5.1)
Sodium: 135 mmol/L (ref 135–145)
TCO2: 25 mmol/L (ref 22–32)

## 2021-08-10 LAB — APTT: aPTT: 36 seconds (ref 24–36)

## 2021-08-10 LAB — PROTIME-INR
INR: 1.1 (ref 0.8–1.2)
Prothrombin Time: 14.5 seconds (ref 11.4–15.2)

## 2021-08-10 MED ORDER — PANTOPRAZOLE SODIUM 40 MG IV SOLR
40.0000 mg | Freq: Every day | INTRAVENOUS | Status: DC
  Administered 2021-08-10: 23:00:00 40 mg via INTRAVENOUS
  Filled 2021-08-10: qty 40

## 2021-08-10 MED ORDER — ACETAMINOPHEN 325 MG PO TABS: 650.0000 mg | ORAL_TABLET | ORAL | Status: DC | PRN

## 2021-08-10 MED ORDER — ACETAMINOPHEN 650 MG RE SUPP: 650.0000 mg | RECTAL | Status: DC | PRN

## 2021-08-10 MED ORDER — ACETAMINOPHEN 160 MG/5ML PO SOLN: 650.0000 mg | ORAL | Status: DC | PRN

## 2021-08-10 MED ORDER — LABETALOL HCL 5 MG/ML IV SOLN: 20.0000 mg | Freq: Once | INTRAVENOUS | Status: DC

## 2021-08-10 MED ORDER — CLEVIDIPINE BUTYRATE 0.5 MG/ML IV EMUL: 0.0000 mg/h | INTRAVENOUS | Status: DC

## 2021-08-10 MED ORDER — IOHEXOL 350 MG/ML SOLN
100.0000 mL | Freq: Once | INTRAVENOUS | Status: AC | PRN
  Administered 2021-08-10: 13:00:00 100 mL via INTRAVENOUS

## 2021-08-10 MED ORDER — INSULIN ASPART 100 UNIT/ML IJ SOLN
0.0000 [IU] | INTRAMUSCULAR | Status: DC
  Administered 2021-08-11: 15:00:00 2 [IU] via SUBCUTANEOUS
  Administered 2021-08-11: 11:00:00 3 [IU] via SUBCUTANEOUS
  Administered 2021-08-11 – 2021-08-12 (×4): 2 [IU] via SUBCUTANEOUS

## 2021-08-10 MED ORDER — TENECTEPLASE FOR STROKE
0.2500 mg/kg | PACK | Freq: Once | INTRAVENOUS | Status: AC
  Administered 2021-08-10: 12:00:00 23 mg via INTRAVENOUS
  Filled 2021-08-10: qty 10

## 2021-08-10 MED ORDER — STROKE: EARLY STAGES OF RECOVERY BOOK
Freq: Once | Status: AC
  Filled 2021-08-10: qty 1

## 2021-08-10 MED ORDER — SODIUM CHLORIDE 0.9 % IV SOLN: INTRAVENOUS | Status: DC

## 2021-08-10 MED ORDER — SODIUM CHLORIDE 0.9% FLUSH
3.0000 mL | Freq: Once | INTRAVENOUS | Status: AC
Start: 2021-08-10 — End: 2021-08-10
  Administered 2021-08-10: 13:00:00 3 mL via INTRAVENOUS

## 2021-08-10 MED ORDER — SENNOSIDES-DOCUSATE SODIUM 8.6-50 MG PO TABS
2.0000 | ORAL_TABLET | Freq: Every day | ORAL | Status: DC
  Administered 2021-08-10 – 2021-08-11 (×2): 2 via ORAL
  Filled 2021-08-10 (×2): qty 2

## 2021-08-10 NOTE — ED Notes (Addendum)
Spoke with Luvenia Redden, daughter, states patient has metal implants placed 8 years ago at [facility]; Contraindicated for MRI. Her contacts in patient emergency contacts.

## 2021-08-10 NOTE — Code Documentation (Signed)
Stroke Response Nurse Documentation Code Documentation  Roger Curry is a 86 y.o. male arriving to [facility] ED via [facility] on 08/10/2021 with past medical hx significant of CAD, hyperlipidemia, hypertension (controlled on meds), diabetes, polyneuropathy, bladder dysfunction (self caths 4x daily, CHF and atrial flutter s/p ablation. On aspirin 81 mg daily. Code stroke was activated by EMS.   Patient lives with family. Occasionally uses canes to ambulate. Patient taken by daughter to see cardiologist to determine if he has afib. LKW around 1030 in the parking lot. Noted to be unable to speak and 911 called around 1130.   Stroke team at the bedside on patient arrival. Labs drawn and patient cleared for CT by Dr. Renaye Rakers. Patient to CT with team. NIHSS 11, see documentation for details and code stroke times. Patient with disoriented, bilateral leg weakness, left decreased sensation, Expressive aphasia , and dysarthria  on exam.   The following imaging was completed: CT, CTA head and neck. Patient is a candidate for IV Thrombolytic. BP 173/82 at 1227. TNK given 1228. Patient is not a candidate for IR due to no LVO suspected. Reported headache at 1330. No change in neuro assessment. Dr. Thomasena Edis notified. Patient's daughter in law at bedside reports patient complains of pain baseline. Will continue to monitor closely. Order for foley placed as patient self caths baseline. Reported to be difficult by daughter in law. Attempt made, unable to place. ED RN to discuss options with EDP.  Care/Plan: admit for stroke workup and close monitoring post TNK. VS/neuro checks Q15x2h, Q30x6h, q1x16h.   Bedside handoff with ED RN.    Ferman Hamming Stroke Response RN

## 2021-08-10 NOTE — Progress Notes (Signed)
PHARMACIST CODE STROKE RESPONSE  Notified to mix TNK at 1225 by Dr. Thomasena Edis Delivered TNK to RN at 1226  TNK dose = 23 mg IV over 5 seconds.   Issues/delays encountered (if applicable): re-cycle BP to confirm inclusion criteria  Daylene Posey 08/10/21 12:30 PM

## 2021-08-10 NOTE — H&P (Addendum)
Neurology H&P  CC: Code stroke  History is obtained from: Chart, EMS.   HPI: Roger Curry is a 86 y.o. male with a PMHx of Aflutter, CAD, CHF, glaucoma, DDD, DM II, polyneuropathy, neurogenic bladder with in and out caths, and CKD III  who presented via EMS as a code stroke. Patient was at his cardiology appointment this am when he developed acute aphasia and some left sided weakness acutely at 1030. EMS activated code stroke and patient was brought to ED.   Upon arrival, a brief exam was conducted for airway clearance at ED bridge and patient was taken emergently to CT suite. CTH was without acute finding. No LVO on CTA. Due to his deficit of aphasia, among others, decision was made to give TNK.   In CT suite, he had no receptive aphasia, but notable expressive aphasia. He hesitated to get his words out, but NP could tell that he was mouthing correct word. When asked his age, he said 36 after a few seconds then held up 5 fingers for 87. He was able to identify key after a few seconds.   NP reviewed chart back to 2012 and found no neurology notes/visits.   After imaging, patient was taken back to ED and will be admitted by [place] due to TNK administration.   LKW: 1030 TNK given? Yes at 1228. (Brief delay due to cycling BP x 2).  IR Thrombectomy? No, no LVO MRS:1  NIHSS:  LOC: 0 Questions: 2 Commands: 0 Eye:0 Visual fields: 0 Face:0 LUE:0 RUE: 0 LLE: 2 RLE: 2 Ataxia: 0 Sensation:1 Language: 2 Dysarthria:2 Extinction/Inattention: 0 Total: 11  ROS: A robust ROS was unable to be performed due to emergent nature of event.   Past Medical History:  Diagnosis Date   Arthritis    Atrial flutter [facility])    ? s/p ablation at [facility].  references in Ct reprt to variant pullmonary venous anatomy and pending pulmonary venous ablation.in 04/2013   Bladder dysfunction    self  caths 4 x daily.    CAD (coronary artery disease)    CHF (congestive heart failure) ([place])    EF on echo 09/2015  35 to 40%.    Closed angle glaucoma    Electrocution 1970s   Renal stones    Spinal stenosis    lumbar and cervical.    Type 2 diabetes mellitus (HCC)    Family History  Problem Relation Age of Onset   Stroke Brother    Hypertension Brother    Social History:  reports that he has quit smoking. He has never used smokeless tobacco. He reports that he does not drink alcohol and does not use drugs.  Prior to Admission medications   Medication Sig Start Date End Date Taking? Authorizing Provider  acetaminophen (TYLENOL) 325 MG tablet Take 650 mg by mouth every 6 (six) hours as needed.    [provider]  amLODipine (NORVASC) 5 MG tablet Take 5 mg by mouth daily.    [provider]  aspirin EC 325 MG EC tablet Take 1 tablet (325 mg total) by mouth 2 (two) times daily. 12/16/19   Donia Ast, PA  cyanocobalamin (,VITAMIN B-12,) 1000 MCG/ML injection Inject 1,000 mcg into the muscle every 30 (thirty) days.    [provider]  dicyclomine (BENTYL) 10 MG capsule Take by mouth.    [provider]  furosemide (LASIX) 40 MG tablet Take 40-60 mg by mouth See admin instructions. 60 mg in the morning,  and 40 mg in the evening    [provider]  gabapentin (NEURONTIN) 600 MG tablet Take 600 mg by mouth 2 (two) times daily.  03/13/16   [provider]  lisinopril (ZESTRIL) 20 MG tablet Take 20 mg by mouth daily.     [provider]  morphine (MSIR) 15 MG tablet Take 1 tablet (15 mg total) by mouth every 6 (six) hours as needed for severe pain. 01/14/20   Jacqlyn Larsen, PA-C  naloxone [facility]) 4 MG/0.1ML LIQD nasal spray kit CALL 911. ADMINISTER A SINGLE SPRAY OF NARCAN IN ONE NOSTRIL. REPEAT EVERY 3 MINUTES AS NEEDED IF NO OR MINIMAL RESPONSE. 03/15/20   [provider]  Omega-3 Fatty Acids (FISH OIL) 1200 MG CAPS Take 1 capsule by mouth 2 (two) times daily.    [provider]  omeprazole (PRILOSEC) 20 MG capsule Take 20 mg  by mouth daily.    [provider]  rosuvastatin (CRESTOR) 20 MG tablet Take 10 mg by mouth daily.    [provider]  tamsulosin (FLOMAX) 0.4 MG CAPS capsule Take 0.4 mg by mouth daily.    [provider]  TRUE METRIX BLOOD GLUCOSE TEST test strip  01/02/20   [provider]  TRULICITY 7.82 NF/6.2ZH SOPN Inject 0.75 mg into the skin every Monday.  09/10/19   [provider]   Exam: Current vital signs: BP (!) 172/84    Pulse 87    Temp 97.7 F (36.5 C) (Oral)    Resp 20    Wt 90.2 kg    SpO2 95%    BMI 32.10 kg/m    Physical Exam  Constitutional: Well but chronically ill appearing male, alert in NAD. WDWN.  Psych: Affect appropriate to situation. Eyes: No scleral injection. HENT: No OP obstruction. Head: Normocephalic/Atraumatic.   Cardiovascular: Normal rate and regular rhythm on telemetry.  No LE edema.  Respiratory: Effort normal.  GI: Soft.  No distension. There is no tenderness.  Skin: WDI.  Neuro: Mental Status: Patient is awake, alert. Unable to assess orientation due to aphasia.  Patient is unable to give a clear and coherent history. No neglect, but expressive aphasia noted.  Cranial Nerves: II: Visual Fields are full. Pupils are equal, round, and reactive to light.  III,IV, VI: EOMI without ptosis or diplopia.  V: Facial sensation is symmetric to light touch.  VII: Facial movement is symmetric. No droop.  VIII: hearing is intact to voice. X: Uvula is midline and palate elevates symmetrically. XI: Shoulder shrug is symmetric. XII: tongue is midline without atrophy or fasciculations.  Motor:  RUE: grip       bicep       tricep                        RLE: thigh       knee        plantar flexion       dorsiflexion LUE: grip       bicep       tricep                        LLE: thigh       knee        plantar flexion       dorsiflexion Tone is normal. Bulk is normal.  Sensory: Sensation is symmetric to light touch in the  UE/LEs. Extinction is absent to DSS.  Deep Tendon Reflexes: 2+ UEs, 0 patella Plantars: Toes are slightly upgoing bilaterally.  Gait:  Deferred.   MD reviewed images:  CTH There is no acute intracranial hemorrhage or evidence of acute infarction. ASPECT score is 10.  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 Chronic microvascular ischemic changes.  CTA head and neck/CTP No large vessel occlusion, hemodynamically significant stenosis, or evidence of dissection. Perfusion imaging demonstrates no evidence of core infarction penumbra.  Mild calcified plaque is present is along the common and proximal internal carotids.  Calcified plaque along the intracranial internal carotids causing up to moderate stenosis.  Assessment: 86 yo male with stroke risk factors of DM II, HTN, HLD, CAD, and AFlutter. BP 186/94 then 173/92. TNK was given 1228 due to high NIHSS and aphasia/stuttering. No LVO, no penumbra or core infarct. Patient was taken back to ED room.   Plan:   CNS:  -Neurology admit s/p TNK.  -No antiplatelets or AC until tomorrow per stroke team.  -Frequent neurology checks. -NIHSS per protocol.  -MRI brain. -CTH stat for any change in neuro status.  -SLP for aphasia.  -NPO for now.  -When cleared, may advance diet to carb modified/heart health.  -PT/OT.   RESP -monitor respiratory status.   CV -Telemetry monitoring for arrhythmia.  -Maintain BP less than 180/105.  -Labetalol prn then Cleviprex prn. -Hold home anti hypertensives for now.  -Fasting lipid panel-Goal LDL < 70. Start or increase dose of statin if over 70.   GI/GU -Monitor ins and outs.  -Protonix 85m IV or po q day.  -Senna S ii poi qhs-hold for loose stools.   -Spastic bladder, ~900cc volume and unable to catheterize. Urology consulted.  HEME -bleeding precautions.   TNK: Post tPA admission plan -BP and NIHSS q15 min x 2 hr, then q30 mins x 2 hours, then q1hr x 16 hrs.  -No antiplatelet  or AC including VTE prophylaxis for 24 hours.  -Avoid hyperthermia. Tylenol prn.  -No punctures of tube insertion x 24 hour post TNK.  -SCDs  ENDO -check Hgb A1c with goal of < 7. -SSI and restart home meds when able.   Fluid/Electrolyte Disorders -BMP in am.  -Stroke education and risk factor management.   Disposition: unknown at this time.   Code Status: Full Code   After code stroke, NP spoke with patient's son on phone to update him and stated Roger Curry (son's wife) is on her way. When spoke to Roger Curry, she stated that patient has presented this way before when he had panic attacks.    This patient is critically ill and at significant risk of neurological worsening, death and care requires constant monitoring of vital signs, hemodynamics,respiratory and cardiac monitoring, neurological assessment, discussion with family, other specialists and medical decision making of high complexity. I spent 73 minutes of neurocritical care time  in the care of  this patient. This was time spent independent of any time provided by nurse practitioner or PA.  Patient seen by KClance Boll MSN, APN-BC and MD. Note/plan to be edited by MD.    Patient seen and examined with KClance BollNP. We reviewed the chart and diagnostic studies and agree with assessment and plan as outlined in the note above.   Electronically signed by:  HLynnae Sandhoff MD Page: [id number]/28/2022, 7:11 PM

## 2021-08-10 NOTE — ED Notes (Signed)
Spoke with Dr Thomasena Edis and notified of patient didtended bladder and increasing pain/discomfort.

## 2021-08-10 NOTE — ED Provider Notes (Signed)
[facility] EMERGENCY DEPARTMENT Provider Note   CSN: [id number] Arrival date & time: 08/10/21  1211  An emergency department physician performed an initial assessment on this suspected stroke patient at 1212.  History CC: Difficulty speaking  Roger Curry is a 86 y.o. 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  Per history provided by EMS, the patient's daughter had reported that he was behaving fine this morning.  He began having slurred speech around 10:30 in the morning.  He was at the cardiology office around 1130 when subsequently began having weakness and worsening slurred speech.  He arrives as a code stroke by EMS.  The patient is complaining of chest pain.  He is having a difficult time articulating anything further.  EMS reports glucose wnl, BP 032'Z systolic, telemetry showing V paced rhythm in the 80-90's.  CC:       Past Medical History:  Diagnosis Date   Arthritis    Atrial flutter [facility])    ? s/p ablation at [facility].  references in Ct reprt to variant pullmonary venous anatomy and pending pulmonary venous ablation.in 04/2013   Bladder dysfunction    self  caths 4 x daily.    CAD (coronary artery disease)    CHF (congestive heart failure) ([place])    EF on echo 09/2015 35 to 40%.    Closed angle glaucoma    Electrocution 1970s   Renal stones    Spinal stenosis    lumbar and cervical.    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   S/P total knee replacement 12/15/2019   Chronic pain of left knee 10/16/2019   Paroxysmal atrial fibrillation ([place]) 09/03/2019   Suprapubic abdominal pain 06/30/2019   Entrapment of right ulnar nerve  05/14/2019   Tinnitus of both ears 01/27/2019   Cutaneous abscess of buttock 10/30/2018   Mixed dyslipidemia 04/02/2017   Cervical radiculopathy 09/28/2016   Acute pain of right shoulder 09/28/2016   H/O prior ablation treatment 08/15/2016   Ischemic cardiomyopathy 10/28/2015   Transaminitis 22/48/2500   Systolic CHF, chronic ([place]) 10/09/2015   Acute gallstone pancreatitis 10/05/2015   Diabetes mellitus type 2, controlled ([place]) 10/05/2015   Essential hypertension 10/05/2015   CAD (coronary artery disease) 10/05/2015   Normocytic normochromic anemia 10/05/2015   S/P CABG (coronary artery bypass graft) 10/04/2015   Polyneuropathy in diseases classified elsewhere ([place]) 10/04/2015   Neurogenic bladder 10/04/2015   Multilevel degenerative disc disease 10/04/2015   Mitral valve disorder 10/04/2015   Hyperlipidemia 10/04/2015   CKD stage 3 secondary to diabetes ([place]) 10/04/2015   Cardiomyopathy ([place]) 10/04/2015   Benign prostatic hyperplasia 10/04/2015   B12 deficiency 10/04/2015   Acid reflux 10/04/2015   Presence of biventricular AICD 03/11/2015   Bilateral carpal tunnel syndrome 10/26/2014   Spinal stenosis of lumbar region 01/26/2014   Diabetic neuropathy ([place]) 01/26/2014   Post laminectomy syndrome 01/26/2014   Urinary calculus 11/10/2013   Displacement of lumbar intervertebral disc 11/10/2013   DDD (degenerative disc disease), lumbosacral 11/10/2013   Congestive heart failure ([place]) 11/10/2013    Past Surgical History:  Procedure Laterality Date   BALLOON ANGIOPLASTY, ARTERY  1992   BILATERAL HEMI SHOULDER ARTHROPLASTY  Left    CHOLECYSTECTOMY N/A 10/08/2015   Procedure: LAPAROSCOPIC CHOLECYSTECTOMY;  Surgeon: Georganna Skeans, MD;  Location: [facility];  Service: General;  Laterality: N/A;   CORONARY ARTERY BYPASS GRAFT  1996   CYSTOSCOPY     ERCP N/A 10/06/2015   Procedure: ENDOSCOPIC RETROGRADE CHOLANGIOPANCREATOGRAPHY (ERCP);  Surgeon: Milus Banister, MD;  Location: [facility];  Service: Endoscopy;  Laterality: N/A;   ESOPHAGOGASTRODUODENOSCOPY  2014   in [place] ICD  2014   [place], 2009   1997 l spine fusion.  2009 lumbar laminectomy, discectomy, fusion.  says he has had total of 4 surgeries.   TOTAL KNEE ARTHROPLASTY Left 12/15/2019   Procedure: TOTAL KNEE ARTHROPLASTY;  Surgeon: Vickey Huger, MD;  Location: [facility];  Service: Orthopedics;  Laterality: Left;       Family History  Problem Relation Age of Onset   Stroke Brother    Hypertension Brother     Social History   Tobacco Use   Smoking status: Former   Smokeless tobacco: Never  Scientific laboratory technician Use: Never used  Substance Use Topics   Alcohol use: No   Drug use: No    Home Medications Prior to Admission medications   Medication Sig Start Date End Date Taking? Authorizing Provider  amLODipine (NORVASC) 5 MG tablet Take 5 mg by mouth daily.   Yes [provider]  aspirin EC 325 MG EC tablet Take 1 tablet (325 mg total) by mouth 2 (two) times daily. Patient taking differently: Take 325 mg by mouth daily. 12/16/19  Yes Donia Ast, PA  carvedilol (COREG) 12.5 MG tablet Take 12.5 mg by mouth 2 (two) times daily. 06/24/21  Yes [provider]  cyanocobalamin (,VITAMIN B-12,) 1000 MCG/ML injection Inject 1,000 mcg into the muscle every 30 (thirty) days.   Yes [provider]  dicyclomine (BENTYL) 10 MG capsule 10 mg 3 (three) times daily before meals.   Yes [provider]  FERREX 150 150 MG capsule Take 150 mg by mouth daily. 07/29/21  Yes [provider]  furosemide (LASIX) 40 MG tablet Take 40-60 mg by mouth See admin instructions. 60 mg in the morning, and 40 mg in the evening   Yes [provider]  gabapentin (NEURONTIN) 600 MG tablet Take 600 mg by mouth 2 (two) times daily.  03/13/16  Yes [provider]  hydrALAZINE (APRESOLINE) 25 MG tablet Take 25 mg  by mouth 3 (three) times daily. 07/16/21  Yes [provider]  isosorbide mononitrate (IMDUR) 60 MG 24 hr tablet Take 60 mg by mouth daily. 07/19/21  Yes [provider]  lisinopril (ZESTRIL) 20 MG tablet Take 20 mg by mouth daily.    Yes [provider]  magnesium oxide (MAG-OX) 400 (240 Mg) MG tablet Take 400 mg by mouth daily. 07/22/21  Yes [provider]  naloxone (NARCAN) 4 MG/0.1ML LIQD nasal spray kit Place 0.4 mg into the nose See admin instructions. CALL 911. ADMINISTER A SINGLE SPRAY OF NARCAN IN ONE NOSTRIL. REPEAT EVERY 3 MINUTES AS NEEDED IF NO OR MINIMAL RESPONSE. 03/15/20  Yes [provider]  nitrofurantoin, macrocrystal-monohydrate, (MACROBID) 100 MG capsule Take 100 mg by mouth at bedtime. 07/29/21  Yes [provider]  Omega-3 Fatty Acids (FISH OIL) 1200 MG CAPS Take 1 capsule by mouth 2 (two) times daily.   Yes [provider]  omeprazole (PRILOSEC) 20  MG capsule Take 20 mg by mouth daily.   Yes [provider]  rosuvastatin (CRESTOR) 20 MG tablet Take 10 mg by mouth daily.   Yes [provider]  tamsulosin (FLOMAX) 0.4 MG CAPS capsule Take 0.4 mg by mouth daily.   Yes [provider]  TRUE METRIX BLOOD GLUCOSE TEST test strip 1 each by Other route as needed (blood sugar). 01/02/20  Yes [provider]  TRULICITY 5.73 UK/0.2RK SOPN Inject 0.75 mg into the skin every Monday.  09/10/19  Yes [provider]  morphine (MSIR) 15 MG tablet Take 1 tablet (15 mg total) by mouth every 6 (six) hours as needed for severe pain. Patient not taking: Reported on 08/10/2021 01/14/20   Jacqlyn Larsen, PA-C    Allergies    Penicillins and Quinine derivatives  Review of Systems   Review of Systems  Unable to perform ROS: Mental status change (level 5 caveat)   Physical Exam Updated Vital Signs BP (!) 171/86    Pulse 71    Temp 97.7 F (36.5 C) (Oral)    Resp 17    Ht 5' 8" (1.727 m)    Wt  76.2 kg    SpO2 92%    BMI 25.54 kg/m   Physical Exam Constitutional:      General: He is not in acute distress. HENT:     Head: Normocephalic and atraumatic.  Eyes:     Conjunctiva/sclera: Conjunctivae normal.     Pupils: Pupils are equal, round, and reactive to light.  Cardiovascular:     Rate and Rhythm: Normal rate.     Pulses: Normal pulses.  Skin:    General: Skin is warm and dry.  Neurological:     Mental Status: He is alert.     Comments: Expressive aphasia  Weakness in bilateral arms and legs (poor effort on strength testing)    ED Results / Procedures / Treatments   Labs (all labs ordered are listed, but only abnormal results are displayed) Labs Reviewed  CBC - Abnormal; Notable for the following components:      Result Value   Hemoglobin 10.8 (*)    HCT 34.8 (*)    MCH 25.4 (*)    RDW 17.2 (*)    All other components within normal limits  COMPREHENSIVE METABOLIC PANEL - Abnormal; Notable for the following components:   Sodium 131 (*)    Glucose, Bld 147 (*)    Calcium 8.8 (*)    All other components within normal limits  I-STAT CHEM 8, ED - Abnormal; Notable for the following components:   BUN 24 (*)    Glucose, Bld 141 (*)    Calcium, Ion 1.12 (*)    Hemoglobin 12.6 (*)    HCT 37.0 (*)    All other components within normal limits  CBG MONITORING, ED - Abnormal; Notable for the following components:   Glucose-Capillary 153 (*)    All other components within normal limits  RESP PANEL BY RT-PCR (FLU A&B, COVID) ARPGX2  PROTIME-INR  APTT  DIFFERENTIAL  URINALYSIS, ROUTINE W REFLEX MICROSCOPIC  TROPONIN I (HIGH SENSITIVITY)  TROPONIN I (HIGH SENSITIVITY)    EKG EKG Interpretation  Date/Time:  Wednesday August 10 2021 12:47:48 EST Ventricular Rate:  80 PR Interval:    QRS Duration: 155 QT Interval:  452 QTC Calculation: 522 R Axis:   125 Text Interpretation: Accelerated junctional rhythm Right bundle branch block Anterolateral infarct, old  Confirmed by Octaviano Glow [phone]) on  08/10/2021 12:55:09 PM  Radiology CT HEAD CODE STROKE WO CONTRAST  Result Date: 08/10/2021 CLINICAL DATA:  Code stroke.  Neuro deficit, acute, stroke suspected EXAM: CT HEAD WITHOUT CONTRAST TECHNIQUE: Contiguous axial images were obtained from the base of the skull through the vertex without intravenous contrast. COMPARISON:  04/15/2020 FINDINGS: Brain: There is no acute intracranial hemorrhage, mass effect, or edema. Chronic right frontal cortical infarct. New but chronic appearing right occipital infarct. Additional patchy hypoattenuation in the supratentorial white matter is nonspecific but probably reflects similar chronic microvascular ischemic changes. Prominence of the ventricles and sulci reflects similar parenchymal volume loss. No extra-axial collection. Vascular: No hyperdense vessel. There is intracranial atherosclerotic calcification at the skull base. Skull: Unremarkable. Sinuses/Orbits: No acute abnormality. Other: Mastoid air cells are clear. ASPECTS ([place] Stroke Program Early CT Score) - Ganglionic level infarction (caudate, lentiform nuclei, internal capsule, insula, M1-M3 cortex): 7 - Supraganglionic infarction (M4-M6 cortex): 3 Total score (0-10 with 10 being normal): 10 IMPRESSION: There is no acute intracranial hemorrhage or evidence of acute infarction. ASPECT score is 10. 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 Chronic microvascular ischemic changes. These results were communicated to Dr. Theda Sers at 12:28 pm on 08/10/2021 by text page via the [facility] messaging system. Electronically Signed   By: Macy Mis M.D.   On: 08/10/2021 12:31   CT ANGIO HEAD NECK W WO CM W PERF (CODE STROKE)  Result Date: 08/10/2021 CLINICAL DATA:  Neuro deficit, acute, stroke suspected; aphasia EXAM: CT ANGIOGRAPHY HEAD AND NECK CT PERFUSION BRAIN TECHNIQUE: Multidetector CT imaging of the head and neck was performed using the standard protocol  during bolus administration of intravenous contrast. Multiplanar CT image reconstructions and MIPs were obtained to evaluate the vascular anatomy. Carotid stenosis measurements (when applicable) are obtained utilizing NASCET criteria, using the distal internal carotid diameter as the denominator. Multiphase CT imaging of the brain was performed following IV bolus contrast injection. Subsequent parametric perfusion maps were calculated using RAPID software. CONTRAST:  142m OMNIPAQUE IOHEXOL 350 MG/ML SOLN COMPARISON:  None. FINDINGS: CTA NECK Aortic arch: Calcified plaque along the arch and patent great vessel origins. No high-grade proximal subclavian stenosis. Right carotid system: Patent. Mild calcified plaque along the common carotid. Additional calcified plaque along the proximal internal carotid with minimal stenosis. Left carotid system: Patent. Mild calcified plaque along the common carotid. Additional calcified plaque along the proximal internal carotid with less than 50% stenosis. Vertebral arteries: Patent.  Left vertebral is dominant. Skeleton: Cervical spine degenerative changes. Other neck: Unremarkable. Upper chest: Right greater than left pleural effusions. Interstitial edema. Review of the MIP images confirms the above findings CTA HEAD Anterior circulation: Intracranial internal carotid arteries are patent with calcified plaque causing moderate stenosis particularly in the paraclinoid regions. Anterior cerebral arteries are patent. Anterior communicating artery is present. Middle cerebral arteries are patent. Posterior circulation: Intracranial vertebral arteries are patent. Calcified plaque is present on the left mild stenosis. Basilar artery is patent. Major cerebellar artery origins are patent. Posterior cerebral arteries are patent. Venous sinuses: Patent as allowed by contrast bolus timing. Review of the MIP images confirms the above findings CT Brain Perfusion Findings: CBF (<30%) Volume: 055m Perfusion (Tmax>6.0s) volume: 59m22mismatch Volume: 59mL78mfarction Location: None. IMPRESSION: No large vessel occlusion, hemodynamically significant stenosis, or evidence of dissection. Perfusion imaging demonstrates no evidence of core infarction penumbra. Mild calcified plaque is present is along the common and proximal internal carotids. Calcified plaque along the intracranial internal carotids causing up to moderate  stenosis. Electronically Signed   By: Macy Mis M.D.   On: 08/10/2021 12:57    Procedures .Critical Care Performed by: Wyvonnia Dusky, MD Authorized by: Wyvonnia Dusky, MD   Critical care provider statement:    Critical care time (minutes):  45   Critical care time was exclusive of:  Separately billable procedures and treating other patients   Critical care was necessary to treat or prevent imminent or life-threatening deterioration of the following conditions:  CNS failure or compromise   Critical care was time spent personally by me on the following activities:  Ordering and performing treatments and interventions, ordering and review of laboratory studies, ordering and review of radiographic studies, pulse oximetry, review of old charts, examination of patient and evaluation of patient's response to treatment   Care discussed with: admitting provider   Comments:     Code stroke, TNK administration, reassessment at bedside, consultation with neurologist, supplemental hx from family members   Medications Ordered in ED Medications   stroke: mapping our early stages of recovery book (has no administration in time range)  0.9 %  sodium chloride infusion (has no administration in time range)  acetaminophen (TYLENOL) tablet 650 mg (has no administration in time range)    Or  acetaminophen (TYLENOL) 160 MG/5ML solution 650 mg (has no administration in time range)    Or  acetaminophen (TYLENOL) suppository 650 mg (has no administration in time range)  senna-docusate  (Senokot-S) tablet 2 tablet (has no administration in time range)  pantoprazole (PROTONIX) injection 40 mg (has no administration in time range)  labetalol (NORMODYNE) injection 20 mg (has no administration in time range)    And  clevidipine (CLEVIPREX) infusion 0.5 mg/mL (has no administration in time range)  sodium chloride flush (NS) 0.9 % injection 3 mL (3 mLs Intravenous Given 08/10/21 1257)  tenecteplase (TNKASE) injection for Stroke 23 mg (23 mg Intravenous Given 08/10/21 1228)  iohexol (OMNIPAQUE) 350 MG/ML injection 100 mL (100 mLs Intravenous Contrast Given 08/10/21 1240)    ED Course  I have reviewed the triage vital signs and the nursing notes.  Pertinent labs & imaging results that were available during my care of the patient were reviewed by me and considered in my medical decision making (see chart for details).  Patient arrives with slurred speech, AMS onset between 10:30-11:30 AM.  Arrives as code stroke.  GCS 13 on arrival, but maintaining his airway.  Tele showing V-paced rhythm (ECG per EMS reviewed, V paced, no evident ischemic findings).  Taken to CT with neurology team at bedside  *  Labs reviewed - no significant abnormalities.  Na 131 (chronic hyponatremia).  Trop 15.  ECG with V-paced rhythm, no STEMI.  Supplemental hx obtained from daughter-in-law, see Ed course below  Discussed with neurology team who will admit post TNK & consideration of MRI, if compatible with pacemaker  Clinical Course as of 08/10/21 1403  Wed Aug 10, 2021  1224 Per discussion between neurology and the patient and family, TNK initiated.  Pt reports he in-and-out catheterizes himself daily for urination, needing to urinate, I've advised that a foley be placed. [MT]  5462 Glucose-Capillary(!): 153 [MT]  7035 Admitted to stroke service - trop 15, unlikely ACS. [MT]  0093 Patient reassessed, appears to have lost all ability to speak, but does understand questions, mumbling on exam.  His  daughter-in-law is now present at bedside.  She reports he has a history of severe anxiety, and has had "anxiety attacks" like  this in the past which have resulted in temporary loss of speech.  She felt he was very anxious about his cardiology appointment today.  This remains a possibility for his aphasia, however I agree with neurology plan for observation. [MT]    Clinical Course User Index [MT] Trifan, Carola Rhine, MD   Final Clinical Impression(s) / ED Diagnoses Final diagnoses:  Aphasia    Rx / DC Orders ED Discharge Orders     None        Wyvonnia Dusky, MD 08/10/21 [phone]

## 2021-08-10 NOTE — Consult Note (Signed)
Urology Consult   Physician requesting consult: Dr. Lynnae Sandhoff  Reason for consult: Urinary retention and difficult urethral catheterization  History of Present Illness: Roger Curry is a 86 y.o. male who was brought to the hospital earlier today with symptoms of an acute stroke.  He received TNK for treatment.  He subsequently developed urinary retention.  He does catheterize with a 62 French catheter 4 times a day for maintenance of a known urethral stricture.  He is followed by his urologist, Dr. Joie Bimler.  Attempts to place a catheter at the hospital today have been unsuccessful.  A urology consultation was obtained.  The patient is currently in acute distress with over 1 L of urine in his bladder noted on ultrasound.   Past Medical History:  Diagnosis Date   Arthritis    Atrial flutter [facility])    ? s/p ablation at [facility].  references in Ct reprt to variant pullmonary venous anatomy and pending pulmonary venous ablation.in 04/2013   Bladder dysfunction    self  caths 4 x daily.    CAD (coronary artery disease)    CHF (congestive heart failure) ([place] [address])    EF on echo 09/2015 35 to 40%.    Closed angle glaucoma    Electrocution 1970s   Renal stones    Spinal stenosis    lumbar and cervical.    Type 2 diabetes mellitus ([place])     Past Surgical History:  Procedure Laterality Date   BALLOON ANGIOPLASTY, ARTERY  1992   BILATERAL HEMI SHOULDER ARTHROPLASTY Left    CHOLECYSTECTOMY N/A 10/08/2015   Procedure: LAPAROSCOPIC CHOLECYSTECTOMY;  Surgeon: Georganna Skeans, MD;  Location: [place];  Service: General;  Laterality: N/A;   CORONARY ARTERY BYPASS GRAFT  1996   CYSTOSCOPY     ERCP N/A 10/06/2015   Procedure: ENDOSCOPIC RETROGRADE CHOLANGIOPANCREATOGRAPHY (ERCP);  Surgeon: Milus Banister, MD;  Location: [place];  Service: Endoscopy;  Laterality: N/A;   ESOPHAGOGASTRODUODENOSCOPY  2014   in [place] ICD  2014   [place],  2009   1997 l spine fusion.  2009 lumbar laminectomy, discectomy, fusion.  says he has had total of 4 surgeries.   TOTAL KNEE ARTHROPLASTY Left 12/15/2019   Procedure: TOTAL KNEE ARTHROPLASTY;  Surgeon: Vickey Huger, MD;  Location: [facility];  Service: Orthopedics;  Laterality: Left;    Medications:  Home meds:  No current facility-administered medications on file prior to encounter.   Current Outpatient Medications on File Prior to Encounter  Medication Sig Dispense Refill   amLODipine (NORVASC) 5 MG tablet Take 5 mg by mouth daily.     aspirin EC 325 MG EC tablet Take 1 tablet (325 mg total) by mouth 2 (two) times daily. (Patient taking differently: Take 325 mg by mouth daily.) 30 tablet 0   carvedilol (COREG) 12.5 MG tablet Take 12.5 mg by mouth 2 (two) times daily.     cyanocobalamin (,VITAMIN B-12,) 1000 MCG/ML injection Inject 1,000 mcg into the muscle every 30 (thirty) days.     dicyclomine (BENTYL) 10 MG capsule 10 mg 3 (three) times daily before meals.     FERREX 150 150 MG capsule Take 150 mg by mouth daily.     furosemide (LASIX) 40 MG tablet Take 40-60 mg by mouth See admin instructions. 60 mg in the morning, and 40 mg in the evening     gabapentin (NEURONTIN) 600 MG tablet Take 600  mg by mouth 2 (two) times daily.      hydrALAZINE (APRESOLINE) 25 MG tablet Take 25 mg by mouth 3 (three) times daily.     isosorbide mononitrate (IMDUR) 60 MG 24 hr tablet Take 60 mg by mouth daily.     lisinopril (ZESTRIL) 20 MG tablet Take 20 mg by mouth daily.      magnesium oxide (MAG-OX) 400 (240 Mg) MG tablet Take 400 mg by mouth daily.     naloxone (NARCAN) 4 MG/0.1ML LIQD nasal spray kit Place 0.4 mg into the nose See admin instructions. CALL 911. ADMINISTER A SINGLE SPRAY OF NARCAN IN ONE NOSTRIL. REPEAT EVERY 3 MINUTES AS NEEDED IF NO OR MINIMAL RESPONSE.     nitrofurantoin, macrocrystal-monohydrate, (MACROBID) 100 MG capsule Take 100 mg by mouth at bedtime.     Omega-3 Fatty Acids (FISH OIL)  1200 MG CAPS Take 1 capsule by mouth 2 (two) times daily.     omeprazole (PRILOSEC) 20 MG capsule Take 20 mg by mouth daily.     rosuvastatin (CRESTOR) 20 MG tablet Take 10 mg by mouth daily.     tamsulosin (FLOMAX) 0.4 MG CAPS capsule Take 0.4 mg by mouth daily.     TRUE METRIX BLOOD GLUCOSE TEST test strip 1 each by Other route as needed (blood sugar).     TRULICITY 5.92 TW/4.4QK SOPN Inject 0.75 mg into the skin every Monday.      morphine (MSIR) 15 MG tablet Take 1 tablet (15 mg total) by mouth every 6 (six) hours as needed for severe pain. (Patient not taking: Reported on 08/10/2021) 12 tablet 0     Scheduled Meds:  labetalol  20 mg Intravenous Once   pantoprazole (PROTONIX) IV  40 mg Intravenous QHS   senna-docusate  2 tablet Oral QHS   Continuous Infusions:  sodium chloride 75 mL/hr at 08/10/21 1519   clevidipine Stopped (08/10/21 1435)   PRN Meds:.acetaminophen **OR** acetaminophen (TYLENOL) oral liquid 160 mg/5 mL **OR** acetaminophen  Allergies:  Allergies  Allergen Reactions   Penicillins Hives and Itching    Childhood allergy   Quinine Derivatives Hives and Itching    Family History  Problem Relation Age of Onset   Stroke Brother    Hypertension Brother     Social History:  reports that he has quit smoking. He has never used smokeless tobacco. He reports that he does not drink alcohol and does not use drugs.  ROS: A complete review of systems was performed.  All systems are negative except for pertinent findings as noted.  Physical Exam:  Vital signs in last 24 hours: Temp:  [97.7 F (36.5 C)-97.9 F (36.6 C)] 97.9 F (36.6 C) (12/28 1700) Pulse Rate:  [65-87] 76 (12/28 1700) Resp:  [12-23] 12 (12/28 1700) BP: (154-186)/(74-87) 160/85 (12/28 1700) SpO2:  [88 %-99 %] 98 % (12/28 1700) Weight:  [76.2 kg-90.2 kg] 76.2 kg (12/28 1259) Constitutional:  Alert and oriented, No acute distress Cardiovascular: Regular rate and rhythm, No JVD Respiratory: Normal  respiratory effort GI: Abdomen is distended. Genitourinary: No CVAT. Normal male phallus, testes are descended bilaterally and non-tender and without masses, scrotum is normal in appearance without lesions or masses, perineum is normal on inspection. Lymphatic: No lymphadenopathy Neurologic: Grossly intact, no focal deficits Psychiatric: Normal mood and affect  Laboratory Data:  Recent Labs    08/10/21 1217 08/10/21 1219  WBC 9.3  --   HGB 10.8* 12.6*  HCT 34.8* 37.0*  PLT 167  --  Recent Labs    08/10/21 1217 08/10/21 1219  NA 131* 135  K 4.5 4.6  CL 99 100  GLUCOSE 147* 141*  BUN 21 24*  CALCIUM 8.8*  --   CREATININE 1.04 0.90     Results for orders placed or performed during the hospital encounter of 08/10/21 (from the past 24 hour(s))  Resp Panel by RT-PCR (Flu A&B, Covid) Nasopharyngeal Swab     Status: None   Collection Time: 08/10/21 12:11 PM   Specimen: Nasopharyngeal Swab; Nasopharyngeal(NP) swabs in vial transport medium  Result Value Ref Range   SARS Coronavirus 2 by RT PCR NEGATIVE NEGATIVE   Influenza A by PCR NEGATIVE NEGATIVE   Influenza B by PCR NEGATIVE NEGATIVE  CBG monitoring, ED     Status: Abnormal   Collection Time: 08/10/21 12:13 PM  Result Value Ref Range   Glucose-Capillary 153 (H) 70 - 99 mg/dL  Protime-INR     Status: None   Collection Time: 08/10/21 12:17 PM  Result Value Ref Range   Prothrombin Time 14.5 11.4 - 15.2 seconds   INR 1.1 0.8 - 1.2  APTT     Status: None   Collection Time: 08/10/21 12:17 PM  Result Value Ref Range   aPTT 36 24 - 36 seconds  CBC     Status: Abnormal   Collection Time: 08/10/21 12:17 PM  Result Value Ref Range   WBC 9.3 4.0 - 10.5 K/uL   RBC 4.25 4.22 - 5.81 MIL/uL   Hemoglobin 10.8 (L) 13.0 - 17.0 g/dL   HCT 34.8 (L) 39.0 - 52.0 %   MCV 81.9 80.0 - 100.0 fL   MCH 25.4 (L) 26.0 - 34.0 pg   MCHC 31.0 30.0 - 36.0 g/dL   RDW 17.2 (H) 11.5 - 15.5 %   Platelets 167 150 - 400 K/uL   nRBC 0.0 0.0 -  0.2 %  Differential     Status: None   Collection Time: 08/10/21 12:17 PM  Result Value Ref Range   Neutrophils Relative % 71 %   Neutro Abs 6.6 1.7 - 7.7 K/uL   Lymphocytes Relative 20 %   Lymphs Abs 1.8 0.7 - 4.0 K/uL   Monocytes Relative 6 %   Monocytes Absolute 0.6 0.1 - 1.0 K/uL   Eosinophils Relative 3 %   Eosinophils Absolute 0.3 0.0 - 0.5 K/uL   Basophils Relative 0 %   Basophils Absolute 0.0 0.0 - 0.1 K/uL   Immature Granulocytes 0 %   Abs Immature Granulocytes 0.04 0.00 - 0.07 K/uL  Comprehensive metabolic panel     Status: Abnormal   Collection Time: 08/10/21 12:17 PM  Result Value Ref Range   Sodium 131 (L) 135 - 145 mmol/L   Potassium 4.5 3.5 - 5.1 mmol/L   Chloride 99 98 - 111 mmol/L   CO2 22 22 - 32 mmol/L   Glucose, Bld 147 (H) 70 - 99 mg/dL   BUN 21 8 - 23 mg/dL   Creatinine, Ser 1.04 0.61 - 1.24 mg/dL   Calcium 8.8 (L) 8.9 - 10.3 mg/dL   Total Protein 7.5 6.5 - 8.1 g/dL   Albumin 3.8 3.5 - 5.0 g/dL   AST 21 15 - 41 U/L   ALT 18 0 - 44 U/L   Alkaline Phosphatase 98 38 - 126 U/L   Total Bilirubin 0.8 0.3 - 1.2 mg/dL   GFR, Estimated >60 >60 mL/min   Anion gap 10 5 - 15  I-stat chem 8, ED  Status: Abnormal   Collection Time: 08/10/21 12:19 PM  Result Value Ref Range   Sodium 135 135 - 145 mmol/L   Potassium 4.6 3.5 - 5.1 mmol/L   Chloride 100 98 - 111 mmol/L   BUN 24 (H) 8 - 23 mg/dL   Creatinine, Ser 0.90 0.61 - 1.24 mg/dL   Glucose, Bld 141 (H) 70 - 99 mg/dL   Calcium, Ion 1.12 (L) 1.15 - 1.40 mmol/L   TCO2 25 22 - 32 mmol/L   Hemoglobin 12.6 (L) 13.0 - 17.0 g/dL   HCT 37.0 (L) 39.0 - 52.0 %  Troponin I (High Sensitivity)     Status: None   Collection Time: 08/10/21 12:21 PM  Result Value Ref Range   Troponin I (High Sensitivity) 15 <18 ng/L  Troponin I (High Sensitivity)     Status: Abnormal   Collection Time: 08/10/21  3:21 PM  Result Value Ref Range   Troponin I (High Sensitivity) 18 (H) <18 ng/L   Recent Results (from the past 240  hour(s))  Resp Panel by RT-PCR (Flu A&B, Covid) Nasopharyngeal Swab     Status: None   Collection Time: 08/10/21 12:11 PM   Specimen: Nasopharyngeal Swab; Nasopharyngeal(NP) swabs in vial transport medium  Result Value Ref Range Status   SARS Coronavirus 2 by RT PCR NEGATIVE NEGATIVE Final    Comment: (NOTE) SARS-CoV-2 target nucleic acids are NOT DETECTED.  The SARS-CoV-2 RNA is generally detectable in upper respiratory specimens during the acute phase of infection. The lowest concentration of SARS-CoV-2 viral copies this assay can detect is 138 copies/mL. A negative result does not preclude SARS-Cov-2 infection and should not be used as the sole basis for treatment or other patient management decisions. A negative result may occur with  improper specimen collection/handling, submission of specimen other than nasopharyngeal swab, presence of viral mutation(s) within the areas targeted by this assay, and inadequate number of viral copies(<138 copies/mL). A negative result must be combined with clinical observations, patient history, and epidemiological information. The expected result is Negative.  Fact Sheet for Patients:  EntrepreneurPulse.com.au  Fact Sheet for Healthcare Providers:  IncredibleEmployment.be  This test is no t yet approved or cleared by the [place] FDA and  has been authorized for detection and/or diagnosis of SARS-CoV-2 by FDA under an Emergency Use Authorization (EUA). This EUA will remain  in effect (meaning this test can be used) for the duration of the COVID-19 declaration under Section 564(b)(1) of the Act, 21 U.S.C.section 360bbb-3(b)(1), unless the authorization is terminated  or revoked sooner.       Influenza A by PCR NEGATIVE NEGATIVE Final   Influenza B by PCR NEGATIVE NEGATIVE Final    Comment: (NOTE) The Xpert Xpress SARS-CoV-2/FLU/RSV plus assay is intended as an aid in the diagnosis of influenza from  Nasopharyngeal swab specimens and should not be used as a sole basis for treatment. Nasal washings and aspirates are unacceptable for Xpert Xpress SARS-CoV-2/FLU/RSV testing.  Fact Sheet for Patients: EntrepreneurPulse.com.au  Fact Sheet for Healthcare Providers: IncredibleEmployment.be  This test is not yet approved or cleared by the [place] FDA and has been authorized for detection and/or diagnosis of SARS-CoV-2 by FDA under an Emergency Use Authorization (EUA). This EUA will remain in effect (meaning this test can be used) for the duration of the COVID-19 declaration under Section 564(b)(1) of the Act, 21 U.S.C. section 360bbb-3(b)(1), unless the authorization is terminated or revoked.  Performed at [facility], [place],  [place] [address]     Renal Function: Recent Labs    08/10/21 1217 08/10/21 1219  CREATININE 1.04 0.90   Estimated Creatinine Clearance: 59.1 mL/min (by C-G formula based on SCr of 0.9 mg/dL).  Radiologic Imaging: CT HEAD WO CONTRAST (5MM)  Result Date: 08/10/2021 CLINICAL DATA:  Stroke, follow-up EXAM: CT HEAD WITHOUT CONTRAST TECHNIQUE: Contiguous axial images were obtained from the base of the skull through the vertex without intravenous contrast. COMPARISON:  Earlier same day FINDINGS: Brain: There is no acute intracranial hemorrhage, mass effect, or edema. No new loss of gray-white differentiation. Small chronic infarcts in the right frontal and occipital lobes again identified. There is no extra-axial fluid collection. Ventricles and sulci are stable in size and configuration. Vascular: Residual contrast prior CTA. Skull: Calvarium is unremarkable. Sinuses/Orbits: No acute finding. Other: None. IMPRESSION: No acute intracranial hemorrhage or evidence of evolving recent infarction. Electronically Signed   By: Macy Mis M.D.   On: 08/10/2021 15:04   DG Chest Portable 1 View  Result  Date: 08/10/2021 CLINICAL DATA:  Pt having chest pain today as well as Code Stroke - hx of CHF, diabetes, flutter, CAD EXAM: PORTABLE CHEST - 1 VIEW COMPARISON:  01/29/2021 FINDINGS: Perihilar and bibasilar interstitial and patchy airspace opacities, left greater than right, new since previous. Mild cardiomegaly. Stable left subclavian AICD. CABG markers. Aortic Atherosclerosis (ICD10-170.0). No effusion. Sternotomy wires. IMPRESSION: Asymmetric perihilar and bibasilar edema or infiltrates, left greater than right, new since previous. Electronically Signed   By: Lucrezia Europe M.D.   On: 08/10/2021 15:30   CT HEAD CODE STROKE WO CONTRAST  Result Date: 08/10/2021 CLINICAL DATA:  Code stroke.  Neuro deficit, acute, stroke suspected EXAM: CT HEAD WITHOUT CONTRAST TECHNIQUE: Contiguous axial images were obtained from the base of the skull through the vertex without intravenous contrast. COMPARISON:  04/15/2020 FINDINGS: Brain: There is no acute intracranial hemorrhage, mass effect, or edema. Chronic right frontal cortical infarct. New but chronic appearing right occipital infarct. Additional patchy hypoattenuation in the supratentorial white matter is nonspecific but probably reflects similar chronic microvascular ischemic changes. Prominence of the ventricles and sulci reflects similar parenchymal volume loss. No extra-axial collection. Vascular: No hyperdense vessel. There is intracranial atherosclerotic calcification at the skull base. Skull: Unremarkable. Sinuses/Orbits: No acute abnormality. Other: Mastoid air cells are clear. ASPECTS ([place] Stroke Program Early CT Score) - Ganglionic level infarction (caudate, lentiform nuclei, internal capsule, insula, M1-M3 cortex): 7 - Supraganglionic infarction (M4-M6 cortex): 3 Total score (0-10 with 10 being normal): 10 IMPRESSION: There is no acute intracranial hemorrhage or evidence of acute infarction. ASPECT score is 10. Chronic small right frontal and occipital  infarcts. Chronic microvascular ischemic changes. These results were communicated to Dr. Theda Sers at 12:28 pm on 08/10/2021 by text page via the [facility] messaging system. Electronically Signed   By: Macy Mis M.D.   On: 08/10/2021 12:31   CT ANGIO HEAD NECK W WO CM W PERF (CODE STROKE)  Result Date: 08/10/2021 CLINICAL DATA:  Neuro deficit, acute, stroke suspected; aphasia EXAM: CT ANGIOGRAPHY HEAD AND NECK CT PERFUSION BRAIN TECHNIQUE: Multidetector CT imaging of the head and neck was performed using the standard protocol during bolus administration of intravenous contrast. Multiplanar CT image reconstructions and MIPs were obtained to evaluate the vascular anatomy. Carotid stenosis measurements (when applicable) are obtained utilizing NASCET criteria, using the distal internal carotid diameter as the denominator. Multiphase CT imaging of the brain was performed following IV bolus contrast injection. Subsequent parametric perfusion maps were  calculated using RAPID software. CONTRAST:  1421m OMNIPAQUE IOHEXOL 350 MG/ML SOLN COMPARISON:  None. FINDINGS: CTA NECK Aortic arch: Calcified plaque along the arch and patent great vessel origins. No high-grade proximal subclavian stenosis. Right carotid system: Patent. Mild calcified plaque along the common carotid. Additional calcified plaque along the proximal internal carotid with minimal stenosis. Left carotid system: Patent. Mild calcified plaque along the common carotid. Additional calcified plaque along the proximal internal carotid with less than 50% stenosis. Vertebral arteries: Patent.  Left vertebral is dominant. Skeleton: Cervical spine degenerative changes. Other neck: Unremarkable. Upper chest: Right greater than left pleural effusions. Interstitial edema. Review of the MIP images confirms the above findings CTA HEAD Anterior circulation: Intracranial internal carotid arteries are patent with calcified plaque causing moderate stenosis particularly in the  paraclinoid regions. Anterior cerebral arteries are patent. Anterior communicating artery is present. Middle cerebral arteries are patent. Posterior circulation: Intracranial vertebral arteries are patent. Calcified plaque is present on the left mild stenosis. Basilar artery is patent. Major cerebellar artery origins are patent. Posterior cerebral arteries are patent. Venous sinuses: Patent as allowed by contrast bolus timing. Review of the MIP images confirms the above findings CT Brain Perfusion Findings: CBF (<30%) Volume: 071mPerfusion (Tmax>6.0s) volume: 21m79mismatch Volume: 21mL921mfarction Location: None. IMPRESSION: No large vessel occlusion, hemodynamically significant stenosis, or evidence of dissection. Perfusion imaging demonstrates no evidence of core infarction penumbra. Mild calcified plaque is present is along the common and proximal internal carotids. Calcified plaque along the intracranial internal carotids causing up to moderate stenosis. Electronically Signed   By: PranMacy Mis.   On: 08/10/2021 12:57    I independently reviewed the above imaging studies.  Procedure: Under sterile conditions, I attempted placement of a 14 French catheter.  Although there was resistance, I was able to successfully place a 14 French Foley catheter into the bladder with return of over 700 cc of grossly clear urine.  This was placed to a bedside bag and left indwelling.  Impression/Recommendation 1.  Urinary retention with difficult urethral catheterization: His current catheter should be left indwelling during his hospitalization and certainly while he is receiving thrombolytic therapy.  If by the end of his hospitalization, he maintains enough dexterity to resume intermittent catheterization and does have access to his own straight 14 French catheters, his catheter can be removed and he can resume intermittent catheterization.  If he does not maintain enough functional status to resume clean intermittent  catheterization, his current catheter should be left indwelling and he should follow-up with his primary urologist, Dr. ChaoNila Nephew AsheEdmundsonlowing discharge.  Please call if further questions.  Les Dutch Gray28/2022, 5:52 PM    [place] CC: Dr. CollTheda Sers

## 2021-08-10 NOTE — ED Notes (Signed)
2nd attempt made to do swallow screen. Patient unable to sit strait up in ED stretcher d/t "back problems".

## 2021-08-10 NOTE — Plan of Care (Signed)
RN spoke to NP and MD about bladder scan amount. RN has tried to in and out cath patient and place a Foley. Neither were successful. Patient is uncomfortable. Urology, Dr. Laverle Patter, paged and he will come see patient.   Roger Norman, MSN, APN-BC Neurology Nurse Practitioner Pager [phone]

## 2021-08-10 NOTE — ED Triage Notes (Signed)
Pt BIB EMS due to a code stroke. Pt was dropped off at his PCP. Pt experienced weakness in lower legs but does not walk at baseline and difficulty getting speech out. Pt also having SOB and chest pain. Pt is alert but having dysarthria on arrival.

## 2021-08-10 NOTE — ED Notes (Signed)
Dr Darnelle Maffucci neurology notified of bladder scan result.

## 2021-08-10 NOTE — ED Notes (Signed)
Roger Curry, daughter, [phone] would like updates on father

## 2021-08-10 NOTE — ED Notes (Addendum)
Urology at bedside for cath.

## 2021-08-11 ENCOUNTER — Inpatient Hospital Stay (HOSPITAL_COMMUNITY): Payer: Medicare HMO

## 2021-08-11 ENCOUNTER — Encounter (HOSPITAL_COMMUNITY): Payer: Self-pay | Admitting: Neurology

## 2021-08-11 DIAGNOSIS — I6389 Other cerebral infarction: Secondary | ICD-10-CM

## 2021-08-11 DIAGNOSIS — I25810 Atherosclerosis of coronary artery bypass graft(s) without angina pectoris: Secondary | ICD-10-CM

## 2021-08-11 DIAGNOSIS — I480 Paroxysmal atrial fibrillation: Secondary | ICD-10-CM

## 2021-08-11 DIAGNOSIS — I6319 Cerebral infarction due to embolism of other precerebral artery: Secondary | ICD-10-CM

## 2021-08-11 DIAGNOSIS — E7800 Pure hypercholesterolemia, unspecified: Secondary | ICD-10-CM

## 2021-08-11 DIAGNOSIS — E1169 Type 2 diabetes mellitus with other specified complication: Secondary | ICD-10-CM

## 2021-08-11 LAB — ECHOCARDIOGRAM COMPLETE
AR max vel: 1.41 cm2
AV Area VTI: 1.32 cm2
AV Area mean vel: 1.28 cm2
AV Mean grad: 4 mmHg
AV Peak grad: 8.1 mmHg
Ao pk vel: 1.42 m/s
Height: 68 in
S' Lateral: 5.3 cm
Weight: 2688 oz

## 2021-08-11 LAB — LIPID PANEL
Cholesterol: 90 mg/dL (ref 0–200)
HDL: 27 mg/dL — ABNORMAL LOW (ref >40)
LDL Cholesterol: 50 mg/dL (ref 0–99)
Total CHOL/HDL Ratio: 3.3 RATIO
Triglycerides: 67 mg/dL (ref <150)
VLDL: 13 mg/dL (ref 0–40)

## 2021-08-11 LAB — COMPREHENSIVE METABOLIC PANEL
ALT: 16 U/L (ref 0–44)
AST: 20 U/L (ref 15–41)
Albumin: 3.3 g/dL — ABNORMAL LOW (ref 3.5–5.0)
Alkaline Phosphatase: 93 U/L (ref 38–126)
Anion gap: 8 (ref 5–15)
BUN: 15 mg/dL (ref 8–23)
CO2: 23 mmol/L (ref 22–32)
Calcium: 8.5 mg/dL — ABNORMAL LOW (ref 8.9–10.3)
Chloride: 103 mmol/L (ref 98–111)
Creatinine, Ser: 0.92 mg/dL (ref 0.61–1.24)
GFR, Estimated: >60 mL/min (ref >60)
Glucose, Bld: 110 mg/dL — ABNORMAL HIGH (ref 70–99)
Potassium: 3.8 mmol/L (ref 3.5–5.1)
Sodium: 134 mmol/L — ABNORMAL LOW (ref 135–145)
Total Bilirubin: 1.1 mg/dL (ref 0.3–1.2)
Total Protein: 6.8 g/dL (ref 6.5–8.1)

## 2021-08-11 LAB — HEMOGLOBIN A1C
Hgb A1c MFr Bld: 6.8 % — ABNORMAL HIGH (ref 4.8–5.6)
Mean Plasma Glucose: 148.46 mg/dL

## 2021-08-11 LAB — GLUCOSE, CAPILLARY
Glucose-Capillary: 137 mg/dL — ABNORMAL HIGH (ref 70–99)
Glucose-Capillary: 118 mg/dL — ABNORMAL HIGH (ref 70–99)
Glucose-Capillary: 123 mg/dL — ABNORMAL HIGH (ref 70–99)

## 2021-08-11 LAB — CBG MONITORING, ED
Glucose-Capillary: 110 mg/dL — ABNORMAL HIGH (ref 70–99)
Glucose-Capillary: 120 mg/dL — ABNORMAL HIGH (ref 70–99)
Glucose-Capillary: 190 mg/dL — ABNORMAL HIGH (ref 70–99)
Glucose-Capillary: 158 mg/dL — ABNORMAL HIGH (ref 70–99)

## 2021-08-11 MED ORDER — POLYSACCHARIDE IRON COMPLEX 150 MG PO CAPS
150.0000 mg | ORAL_CAPSULE | Freq: Every day | ORAL | Status: DC
  Administered 2021-08-11 – 2021-08-12 (×2): 150 mg via ORAL
  Filled 2021-08-11 (×2): qty 1

## 2021-08-11 MED ORDER — CARVEDILOL 12.5 MG PO TABS
12.5000 mg | ORAL_TABLET | Freq: Two times a day (BID) | ORAL | Status: DC
  Administered 2021-08-11 – 2021-08-12 (×3): 12.5 mg via ORAL
  Filled 2021-08-11: qty 1
  Filled 2021-08-11: qty 4
  Filled 2021-08-11: qty 1

## 2021-08-11 MED ORDER — ISOSORBIDE MONONITRATE ER 60 MG PO TB24
60.0000 mg | ORAL_TABLET | Freq: Every day | ORAL | Status: DC
  Administered 2021-08-11 – 2021-08-12 (×2): 60 mg via ORAL
  Filled 2021-08-11: qty 2
  Filled 2021-08-11: qty 1

## 2021-08-11 MED ORDER — MAGNESIUM OXIDE -MG SUPPLEMENT 400 (240 MG) MG PO TABS
400.0000 mg | ORAL_TABLET | Freq: Every day | ORAL | Status: DC
  Administered 2021-08-11 – 2021-08-12 (×2): 400 mg via ORAL
  Filled 2021-08-11 (×2): qty 1

## 2021-08-11 MED ORDER — CHLORHEXIDINE GLUCONATE CLOTH 2 % EX PADS
6.0000 | MEDICATED_PAD | Freq: Every day | CUTANEOUS | Status: DC
  Administered 2021-08-11 – 2021-08-12 (×2): 6 via TOPICAL

## 2021-08-11 MED ORDER — LABETALOL HCL 5 MG/ML IV SOLN
5.0000 mg | INTRAVENOUS | Status: DC | PRN
Start: 2021-08-11 — End: 2021-08-13

## 2021-08-11 MED ORDER — APIXABAN 5 MG PO TABS
5.0000 mg | ORAL_TABLET | Freq: Two times a day (BID) | ORAL | Status: DC
  Administered 2021-08-11 – 2021-08-12 (×3): 5 mg via ORAL
  Filled 2021-08-11 (×3): qty 1

## 2021-08-11 MED ORDER — GABAPENTIN 300 MG PO CAPS
600.0000 mg | ORAL_CAPSULE | Freq: Two times a day (BID) | ORAL | Status: DC
  Administered 2021-08-11 – 2021-08-12 (×3): 600 mg via ORAL
  Filled 2021-08-11 (×3): qty 2

## 2021-08-11 MED ORDER — ROSUVASTATIN CALCIUM 5 MG PO TABS
10.0000 mg | ORAL_TABLET | Freq: Every day | ORAL | Status: DC
  Administered 2021-08-11 – 2021-08-12 (×2): 10 mg via ORAL
  Filled 2021-08-11 (×2): qty 2

## 2021-08-11 MED ORDER — PANTOPRAZOLE SODIUM 40 MG PO TBEC
40.0000 mg | DELAYED_RELEASE_TABLET | Freq: Every day | ORAL | Status: DC
  Administered 2021-08-11 – 2021-08-12 (×2): 40 mg via ORAL
  Filled 2021-08-11 (×2): qty 1

## 2021-08-11 MED ORDER — POLYETHYLENE GLYCOL 3350 17 G PO PACK
17.0000 g | PACK | Freq: Every day | ORAL | Status: DC
  Administered 2021-08-12: 09:00:00 17 g via ORAL
  Filled 2021-08-11 (×2): qty 1

## 2021-08-11 NOTE — ED Notes (Signed)
Pt was ambulatory to the restroom with 2 assist.

## 2021-08-11 NOTE — ED Notes (Signed)
Ellis Parents (Daughter) of Roger Curry called asking for an update, then hang up before I could check with the nurse.

## 2021-08-11 NOTE — Progress Notes (Signed)
STROKE TEAM PROGRESS NOTE   SUBJECTIVE (INTERVAL HISTORY) No family is at the bedside.  Pt lying in bed, moving all extremities symmetrically but still has significant stuttering speech and dysarthria, no aphasia. He has AICD and not compatible with MRI so we have to repeat CT at 24h after TNK. AICD interrogation showed persistent afib from 06/26/2021 until 08/09/21. AICD also showed possible afib in 12/2020 but more shorter duration.    OBJECTIVE Temp:  [97.1 F (36.2 C)-98.2 F (36.8 C)] 97.6 F (36.4 C) (12/29 1130) Pulse Rate:  [57-87] 66 (12/29 1130) Cardiac Rhythm: Normal sinus rhythm (12/29 0946) Resp:  [12-33] 18 (12/29 1130) BP: (111-186)/(56-90) 161/74 (12/29 1130) SpO2:  [88 %-99 %] 98 % (12/29 1130) Weight:  [76.2 kg-90.2 kg] 76.2 kg (12/28 1259)  Recent Labs  Lab 08/10/21 2030 08/10/21 2253 08/11/21 0234 08/11/21 0810 08/11/21 1046  GLUCAP 115* 103* 110* 120* 190*   Recent Labs  Lab 08/10/21 1217 08/10/21 1219 08/11/21 0347  NA 131* 135 134*  K 4.5 4.6 3.8  CL 99 100 103  CO2 22  --  23  GLUCOSE 147* 141* 110*  BUN 21 24* 15  CREATININE 1.04 0.90 0.92  CALCIUM 8.8*  --  8.5*   Recent Labs  Lab 08/10/21 1217 08/11/21 0347  AST 21 20  ALT 18 16  ALKPHOS 98 93  BILITOT 0.8 1.1  PROT 7.5 6.8  ALBUMIN 3.8 3.3*   Recent Labs  Lab 08/10/21 1217 08/10/21 1219  WBC 9.3  --   NEUTROABS 6.6  --   HGB 10.8* 12.6*  HCT 34.8* 37.0*  MCV 81.9  --   PLT 167  --    No results for input(s): CKTOTAL, CKMB, CKMBINDEX, TROPONINI in the last 168 hours. Recent Labs    08/10/21 1217  LABPROT 14.5  INR 1.1   Recent Labs    08/10/21 2020  COLORURINE STRAW*  LABSPEC 1.015  PHURINE 7.0  GLUCOSEU NEGATIVE  HGBUR NEGATIVE  BILIRUBINUR NEGATIVE  KETONESUR NEGATIVE  PROTEINUR 100*  NITRITE NEGATIVE  LEUKOCYTESUR NEGATIVE       Component Value Date/Time   CHOL 90 08/11/2021 0346   TRIG 67 08/11/2021 0346   HDL 27 (L) 08/11/2021 0346   CHOLHDL 3.3  08/11/2021 0346   VLDL 13 08/11/2021 0346   LDLCALC 50 08/11/2021 0346   Lab Results  Component Value Date   HGBA1C 6.8 (H) 08/11/2021   No results found for: LABOPIA, COCAINSCRNUR, LABBENZ, AMPHETMU, THCU, LABBARB  No results for input(s): ETH in the last 168 hours.  I have personally reviewed the radiological images below and agree with the radiology interpretations.  CT HEAD WO CONTRAST (5MM)  Result Date: 08/10/2021 CLINICAL DATA:  Stroke, follow-up EXAM: CT HEAD WITHOUT CONTRAST TECHNIQUE: Contiguous axial images were obtained from the base of the skull through the vertex without intravenous contrast. COMPARISON:  Earlier same day FINDINGS: Brain: There is no acute intracranial hemorrhage, mass effect, or edema. No new loss of gray-white differentiation. Small chronic infarcts in the right frontal and occipital lobes again identified. There is no extra-axial fluid collection. Ventricles and sulci are stable in size and configuration. Vascular: Residual contrast prior CTA. Skull: Calvarium is unremarkable. Sinuses/Orbits: No acute finding. Other: None. IMPRESSION: No acute intracranial hemorrhage or evidence of evolving recent infarction. Electronically Signed   By: Macy Mis M.D.   On: 08/10/2021 15:04   DG Chest Portable 1 View  Result Date: 08/10/2021 CLINICAL DATA:  Pt having chest pain  today as well as Code Stroke - hx of CHF, diabetes, flutter, CAD EXAM: PORTABLE CHEST - 1 VIEW COMPARISON:  01/29/2021 FINDINGS: Perihilar and bibasilar interstitial and patchy airspace opacities, left greater than right, new since previous. Mild cardiomegaly. Stable left subclavian AICD. CABG markers. Aortic Atherosclerosis (ICD10-170.0). No effusion. Sternotomy wires. IMPRESSION: Asymmetric perihilar and bibasilar edema or infiltrates, left greater than right, new since previous. Electronically Signed   By: Corlis Leak M.D.   On: 08/10/2021 15:30   CT HEAD CODE STROKE WO CONTRAST  Result Date:  08/10/2021 CLINICAL DATA:  Code stroke.  Neuro deficit, acute, stroke suspected EXAM: CT HEAD WITHOUT CONTRAST TECHNIQUE: Contiguous axial images were obtained from the base of the skull through the vertex without intravenous contrast. COMPARISON:  04/15/2020 FINDINGS: Brain: There is no acute intracranial hemorrhage, mass effect, or edema. Chronic right frontal cortical infarct. New but chronic appearing right occipital infarct. Additional patchy hypoattenuation in the supratentorial white matter is nonspecific but probably reflects similar chronic microvascular ischemic changes. Prominence of the ventricles and sulci reflects similar parenchymal volume loss. No extra-axial collection. Vascular: No hyperdense vessel. There is intracranial atherosclerotic calcification at the skull base. Skull: Unremarkable. Sinuses/Orbits: No acute abnormality. Other: Mastoid air cells are clear. ASPECTS ([place] Stroke Program Early CT Score) - Ganglionic level infarction (caudate, lentiform nuclei, internal capsule, insula, M1-M3 cortex): 7 - Supraganglionic infarction (M4-M6 cortex): 3 Total score (0-10 with 10 being normal): 10 IMPRESSION: There is no acute intracranial hemorrhage or evidence of acute infarction. ASPECT score is 10. 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 Chronic microvascular ischemic changes. These results were communicated to Dr. Thomasena Edis at 12:28 pm on 08/10/2021 by text page via the [facility] messaging system. Electronically Signed   By: Guadlupe Spanish M.D.   On: 08/10/2021 12:31   CT ANGIO HEAD NECK W WO CM W PERF (CODE STROKE)  Result Date: 08/10/2021 CLINICAL DATA:  Neuro deficit, acute, stroke suspected; aphasia EXAM: CT ANGIOGRAPHY HEAD AND NECK CT PERFUSION BRAIN TECHNIQUE: Multidetector CT imaging of the head and neck was performed using the standard protocol during bolus administration of intravenous contrast. Multiplanar CT image reconstructions and MIPs were obtained to evaluate the  vascular anatomy. Carotid stenosis measurements (when applicable) are obtained utilizing NASCET criteria, using the distal internal carotid diameter as the denominator. Multiphase CT imaging of the brain was performed following IV bolus contrast injection. Subsequent parametric perfusion maps were calculated using RAPID software. CONTRAST:  OMNIPAQUE IOHEXOL 350 MG/ML SOLN COMPARISON:  None. FINDINGS: CTA NECK Aortic arch: Calcified plaque along the arch and patent great vessel origins. No high-grade proximal subclavian stenosis. Right carotid system: Patent. Mild calcified plaque along the common carotid. Additional calcified plaque along the proximal internal carotid with minimal stenosis. Left carotid system: Patent. Mild calcified plaque along the common carotid. Additional calcified plaque along the proximal internal carotid with less than 50% stenosis. Vertebral arteries: Patent.  Left vertebral is dominant. Skeleton: Cervical spine degenerative changes. Other neck: Unremarkable. Upper chest: Right greater than left pleural effusions. Interstitial edema. Review of the MIP images confirms the above findings CTA HEAD Anterior circulation: Intracranial internal carotid arteries are patent with calcified plaque causing moderate stenosis particularly in the paraclinoid regions. Anterior cerebral arteries are patent. Anterior communicating artery is present. Middle cerebral arteries are patent. Posterior circulation: Intracranial vertebral arteries are patent. Calcified plaque is present on the left mild stenosis. Basilar artery is patent. Major cerebellar artery origins are patent. Posterior cerebral arteries are patent. Venous sinuses:  Patent as allowed by contrast bolus timing. Review of the MIP images confirms the above findings CT Brain Perfusion Findings: CBF (<30%) Volume: 60mL Perfusion (Tmax>6.0s) volume: 54mL Mismatch Volume: 37mL Infarction Location: None. IMPRESSION: No large vessel occlusion,  hemodynamically significant stenosis, or evidence of dissection. Perfusion imaging demonstrates no evidence of core infarction penumbra. Mild calcified plaque is present is along the common and proximal internal carotids. Calcified plaque along the intracranial internal carotids causing up to moderate stenosis. Electronically Signed   By: Macy Mis M.D.   On: 08/10/2021 12:57     PHYSICAL EXAM  Temp:  [97.1 F (36.2 C)-98.2 F (36.8 C)] 97.6 F (36.4 C) (12/29 1130) Pulse Rate:  [57-87] 66 (12/29 1130) Resp:  [12-33] 18 (12/29 1130) BP: (111-186)/(56-90) 161/74 (12/29 1130) SpO2:  [88 %-99 %] 98 % (12/29 1130) Weight:  [76.2 kg-90.2 kg] 76.2 kg (12/28 1259)  General - Well nourished, well developed, in no apparent distress.  Ophthalmologic - fundi not visualized due to noncooperation.  Cardiovascular - Regular rhythm and rate, not in afib now.  Neuro - awake, alert, eyes open, orientated to place, time, and DOB but told me he is 86 yo instead of 86. No aphasia, but significant stuttering speech and mild dysarthria, following all simple commands. Able to name and repeat in stuttering voice. No gaze palsy, tracking bilaterally, visual field full, PERRL. Right nasolabial fold flatterning. Tongue midline. Bilateral UEs 3+/5, no drift within 10 sec but drift bilaterally after 10 sec and he stated that he is tired both arms. Bilaterally LEs 3/5. Sensation symmetrical bilaterally, b/l FTN intact, gait not tested.     ASSESSMENT/PLAN Mr. MAHER LAUDADIO is a 86 y.o. male with history of PAF and Aflutter s/p ablation, CAD/MI s/p stenting and AICD, CHF, DM, CKD III admitted for aphasia and left sided weakness. TNK given around 12pm 08/10/21.    Stroke:  not able to do MRI and stroke symptoms and exam not quite consistent, but likely due to embolic secondary to afib not on [facility] Symptom documentation and exam not consistent (in Dr. Ola Spurr office, pt had slurry speech and b/l UE weakness but no  focal deficit. In ER with Dr. Theda Sers, he documented LUE and LLE weakness, 2 each on NIHSS. On my exam today, he had no focal deficit except right nasolabial fold flattening).  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 CTA head and neck, no LVO, athero at b/l ICA siphon and bulbs MRI not able to do due to AICD CT repeat pending 2D Echo  pending LDL 50 HgbA1c 6.8 SCDs for VTE prophylaxis aspirin 325 mg daily prior to admission, now on No antithrombotic within 24h of TNK. But will consider DOAC if Ct repeat no large infarct or bleeding Ongoing aggressive stroke risk factor management Therapy recommendations:  pending Disposition:  pending   Afib and aflutter CAD/MI s/p stenting and CABG S/p AICD Cardiac hx complicated  Follows with Dr. Otho Perl, Dr. Ola Spurr in [facility] Per Dr. Sudie Grumbling note in 12/2020 " The story I been able to piece together is that sometime around 1996 or 1997 he was having significant back problems and back surgery was being contemplated. He saw physician for preoperative cardiac assessment and must of had abnormal stress test. Underwent cardiac catheterization with stenting. He then had his back surgery and did well up until 1998 when he had some recurrent chest pain and may have had an MI. He underwent cardiac catheterization at that time revealing  recurrent multivessel coronary disease and he underwent coronary artery bypass grafting. He was left with an ischemic dilated cardiomyopathy and and apparently had some problems with atrial arrhythmias over the years. At some point, he had a flutter ablation performed and then at some point had a defibrillator placed. He is had follow-up visits in our device clinic and sees Dr. Ola Spurr annually in follow-up of his arrhythmias. He had not seen a general cardiologist until my visit with him in December 2021 after the departure of Dr. Bettina Gavia from our group". Per Dr. Ola Spurr not in 04/2021 "Status post ablation  of atrial flutter-no atrial arrhythmias detected using the device as a monitor." Pt saw Dr. Ola Spurr 08/10/21 (yesterday) for recent onset of afib and discussing about AC. Pt developed slurry speech ang b/l UE weakness, no focal deficit. Code stroke activation and he received TNK in ER AICD interrogation showed persistent afib from 06/26/2021 until 08/09/21. AICD also showed possible afib in 12/2020 but more shorter duration. Agree with Dr. Ola Spurr, will need [facility] once repeat CT no bleeding or large infarct.   Diabetes HgbA1c 6.8 goal < 7.0 Controlled CBG monitoring SSI DM education and close PCP follow up  Hypertension Stable on the high end BP goal < 180/105 post TNK Resume home coreg and imdur Gradually resume other home BP meds Long term BP goal normotensive  Hyperlipidemia Home meds:  crestor 10  LDL 50, goal < 70 Now on crestor 10 No high intensity give advance age and LDL at goal Continue statin at discharge  Other Stroke Risk Factors Advanced age CHF  Other Active Problems   Hospital day # 1  This patient is critically ill due to stroke s/p TNK, PAF not on AC and at significant risk of neurological worsening, death form recurrent stroke, bleeding after TNK, seizure. This patient's care requires constant monitoring of vital signs, hemodynamics, respiratory and cardiac monitoring, review of multiple databases, neurological assessment, discussion with family, other specialists and medical decision making of high complexity. I spent 40 minutes of neurocritical care time in the care of this patient.  Rosalin Hawking, MD PhD Stroke Neurology 08/11/2021 11:46 AM    To contact Stroke Continuity provider, please refer to [email or web address] After hours, contact General Neurology

## 2021-08-11 NOTE — Progress Notes (Signed)
°  Echocardiogram 2D Echocardiogram has been performed.  Roger Curry F 08/11/2021, 9:01 AM

## 2021-08-11 NOTE — Evaluation (Signed)
Physical Therapy Evaluation Patient Details Name: Roger Curry MRN: [id number] DOB: March 30, 1935 Today's Date: 08/11/2021  History of Present Illness  This 86 y.o. male presented to ED with acute onset aphasia and some Lt sided weakness.  CTA negative for LVO.  He was given TNK.  He is unable to [facility] MRI.  Repeat head CT negative for acute stroke, however, neurolgy feels symptoms likely due to embolic infarct due to A_fib and pt on anticoagulated.  PMH includes: CAD, CHF, glaucoma, DDD, DM, polyneuropathy, neurogenic bladder with in and out cath, CKD stage 3, s/p Lt TKA.  Clinical Impression  Pt admitted secondary to problem above with deficits below. Pt requiring min A for very short distance mobility this session. Pt with DOE at [place]. Pt currently lives alone. Feel pt would benefit from SNF level therapies unless pt's family able to provide necessary assist at d/c. Will continue to follow acutely.        Recommendations for follow up therapy are one component of a multi-disciplinary discharge planning process, led by the attending physician.  Recommendations may be updated based on patient status, additional functional criteria and insurance authorization.  Follow Up Recommendations Skilled nursing-short term rehab (<3 hours/day) (unless family can provide necessary assist)    Assistance Recommended at Discharge Frequent or constant Supervision/Assistance  Functional Status Assessment Patient has had a recent decline in their functional status and demonstrates the ability to make significant improvements in function in a reasonable and predictable amount of time.  Equipment Recommendations  Rolling walker (2 wheels)    Recommendations for Other Services       Precautions / Restrictions Precautions Precautions: Fall Restrictions Weight Bearing Restrictions: No      Mobility  Bed Mobility Overal bed mobility: Needs Assistance Bed Mobility: Supine to Sit;Sit to Supine      Supine to sit: Supervision Sit to supine: Supervision   General bed mobility comments: pt with DOE 3/4 with activity    Transfers Overall transfer level: Needs assistance   Transfers: Sit to/from Stand;Bed to chair/wheelchair/BSC Sit to Stand: Min assist           General transfer comment: DOE 3/4 - 4/4 with activity.  HHA provided as pt ambulates with 2 canes at baseline    Ambulation/Gait Ambulation/Gait assistance: Min guard;+2 physical assistance;Min assist Gait Distance (Feet): 6 Feet Assistive device: 2 person hand held assist Gait Pattern/deviations: Step-through pattern;Decreased stride length Gait velocity: Decreased     General Gait Details: Very slow, cautious steps. Increased DOE with short distance ambulation. Min to min guard A for steadying.  Stairs            Wheelchair Mobility    Modified Rankin (Stroke Patients Only)       Balance Overall balance assessment: Mild deficits observed, not formally tested                                           Pertinent Vitals/Pain Pain Assessment: Faces Faces Pain Scale: Hurts a little bit Pain Location: back Pain Descriptors / Indicators: Grimacing;Guarding Pain Intervention(s): Limited activity within patient's tolerance;Monitored during session;Repositioned    Home Living Family/patient expects to be discharged to:: Private residence Living Arrangements: Alone Available Help at Discharge: Family;Available PRN/intermittently Type of Home: House Home Access: Ramped entrance       Home Layout: One level Home Equipment: Conservation officer, nature (2 wheels);Cane -  single point Additional Comments: son lives close by but works during the day    Prior Function Prior Level of Function : Independent/Modified Independent;Driving             Mobility Comments: ambulates with 2 canes ADLs Comments: manages his own finances, drives, manages medications     Hand Dominance   Dominant  Hand: Right    Extremity/Trunk Assessment   Upper Extremity Assessment Upper Extremity Assessment: Defer to OT evaluation    Lower Extremity Assessment Lower Extremity Assessment: Generalized weakness (symmetrical movement throughout BLE. Very effortful to get to EOB, however, could easily move legs back into bed.)    Cervical / Trunk Assessment Cervical / Trunk Assessment: Back Surgery;Other exceptions Cervical / Trunk Exceptions: flexed hips and neck  Communication   Communication: Expressive difficulties (effortful and stuttering)  Cognition Arousal/Alertness: Awake/alert Behavior During Therapy: WFL for tasks assessed/performed Overall Cognitive Status: Within Functional Limits for tasks assessed                                 General Comments: grossly assessed and appears [facility]        General Comments General comments (skin integrity, edema, etc.): HR 78, sp02 93-95% with pt on RA.  DOE 3/4 - 4/4 with activity and pt fatigues quite quickly with minimal activity    Exercises     Assessment/Plan    PT Assessment Patient needs continued PT services  PT Problem List Decreased strength;Decreased activity tolerance;Decreased balance;Decreased mobility;Decreased knowledge of use of DME       PT Treatment Interventions DME instruction;Gait training;Functional mobility training;Therapeutic activities;Therapeutic exercise;Balance training;Patient/family education    PT Goals (Current goals can be found in the Care Plan section)  Acute Rehab PT Goals Patient Stated Goal: none stated PT Goal Formulation: With patient Time For Goal Achievement: 08/25/21 Potential to Achieve Goals: Good    Frequency Min 3X/week   Barriers to discharge        Co-evaluation               AM-PAC PT "6 Clicks" Mobility  Outcome Measure Help needed turning from your back to your side while in a flat bed without using bedrails?: A Little Help needed moving from lying on  your back to sitting on the side of a flat bed without using bedrails?: A Little Help needed moving to and from a bed to a chair (including a wheelchair)?: A Little Help needed standing up from a chair using your arms (e.g., wheelchair or bedside chair)?: A Little Help needed to walk in hospital room?: A Lot Help needed climbing 3-5 steps with a railing? : Total 6 Click Score: 15    End of Session Equipment Utilized During Treatment: Gait belt Activity Tolerance: Patient limited by fatigue Patient left: in bed;with call bell/phone within reach (on stretcher in ED) Nurse Communication: Mobility status PT Visit Diagnosis: Unsteadiness on feet (R26.81);Muscle weakness (generalized) (M62.81)    Time: 4742-5956 PT Time Calculation (min) (ACUTE ONLY): 22 min   Charges:   PT Evaluation $PT Eval Moderate Complexity: 1 Mod          Farley Ly, PT, DPT  Acute Rehabilitation Services  Pager: [phone] Office: [phone]   Lehman Prom 08/11/2021, 4:29 PM

## 2021-08-11 NOTE — Progress Notes (Signed)
OT Cancellation Note  Patient Details Name: Roger Curry MRN: [id number] DOB: 03-11-1935   Cancelled Treatment:    Reason Eval/Treat Not Completed: Active bedrest order.  Will attempt once activity increased.  Eber Jones., OTR/L Acute Rehabilitation Services Pager [phone] Office [phone]   Jeani Hawking M 08/11/2021, 8:33 AM

## 2021-08-11 NOTE — Evaluation (Signed)
Occupational Therapy Evaluation Patient Details Name: Roger Curry MRN: [id number] DOB: March 23, 1935 Today's Date: 08/11/2021   History of Present Illness This 86 y.o. male presented to ED with acute onset aphasia and some Lt sided weakness.  CTA negative for LVO.  He was given TNK.  He is unable to [facility] MRI.  Repeat head CT negative for acute stroke, however, neurolgy feels symptoms likely due to embolic infarct due to A_fib and pt on anticoagulated.  PMH includes: CAD, CHF, glaucoma, DDD, DM, polyneuropathy, neurogenic bladder with in and out cath, CKD stage 3, s/p Lt TKA, lumb   Clinical Impression   Pt admitted with above. He demonstrates the below listed deficits and will benefit from continued OT to maximize safety and independence with BADLs.  Pt presents to OT with generalized weakness, decreased activity tolerance,  communication deficits, and balance deficits.  He currently requires set up assist - min A for ADLs and min guard to min A for functional mobility.  He fatigues quite rapidly with minimal activity.  He reports he lives alone, but family close by, but work during the day.   Recommend SNF level rehab unless family can provide assist with ADLs and mobility until he is back to baseline.  Will follow.        Recommendations for follow up therapy are one component of a multi-disciplinary discharge planning process, led by the attending physician.  Recommendations may be updated based on patient status, additional functional criteria and insurance authorization.   Follow Up Recommendations  Skilled nursing-short term rehab (<3 hours/day)    Assistance Recommended at Discharge Frequent or constant Supervision/Assistance  Functional Status Assessment  Patient has had a recent decline in their functional status and demonstrates the ability to make significant improvements in function in a reasonable and predictable amount of time.  Equipment Recommendations  BSC/3in1     Recommendations for Other Services       Precautions / Restrictions Precautions Precautions: Fall      Mobility Bed Mobility Overal bed mobility: Needs Assistance Bed Mobility: Supine to Sit;Sit to Supine     Supine to sit: Supervision Sit to supine: Supervision   General bed mobility comments: pt with DOE 3/4 with activity    Transfers Overall transfer level: Needs assistance   Transfers: Sit to/from Stand;Bed to chair/wheelchair/BSC Sit to Stand: Min assist     Step pivot transfers: Min assist;From elevated surface     General transfer comment: DOE 3/4 - 4/4 with activity.  HHA provided as pt ambulates with 2 canes at baseline      Balance Overall balance assessment: Mild deficits observed, not formally tested                                         ADL either performed or assessed with clinical judgement   ADL Overall ADL's : Needs assistance/impaired Eating/Feeding: Modified independent;Bed level   Grooming: Wash/dry hands;Wash/dry face;Oral care;Brushing hair;Min guard;Standing;Sitting   Upper Body Bathing: Set up;Supervision/ safety;Sitting   Lower Body Bathing: Minimal assistance;Sit to/from stand   Upper Body Dressing : Set up;Supervision/safety;Sitting   Lower Body Dressing: Minimal assistance;Sit to/from stand Lower Body Dressing Details (indicate cue type and reason): pt able to doff socks while seated edge of stretcher.  requires assist to steady and fatigues quite rapidly Toilet Transfer: Minimal assistance;Ambulation;Comfort height toilet   Toileting- Clothing Manipulation and Hygiene: Minimal assistance;Sit  to/from stand               Vision Baseline Vision/History: 3 Glaucoma;1 Wears glasses Patient Visual Report: No change from baseline Additional Comments: Fields grossly WFL.  Pt able to read the clock on the wall accurately.  he frequently closed and fluttered his eyes when performing pursuits.  EOMs full      Perception     Praxis      Pertinent Vitals/Pain Pain Assessment: Faces Faces Pain Scale: Hurts a little bit Pain Location: back Pain Descriptors / Indicators: Grimacing;Guarding Pain Intervention(s): Monitored during session     Hand Dominance Right   Extremity/Trunk Assessment Upper Extremity Assessment Upper Extremity Assessment: Overall WFL for tasks assessed (movement of bil. UEs very slow and effortful when asked to perform AROM, however, when his lunch tray arrived, he was able to move them fluidly and effortlessly to reach for items, open containers and cut foot.  movement of bil. UEs symmetrical)   Lower Extremity Assessment Lower Extremity Assessment: Defer to PT evaluation   Cervical / Trunk Assessment Cervical / Trunk Assessment: Back Surgery;Other exceptions Cervical / Trunk Exceptions: flexed hips and neck   Communication Communication Communication: Expressive difficulties (effortful and stuttering)   Cognition Arousal/Alertness: Awake/alert Behavior During Therapy: WFL for tasks assessed/performed Overall Cognitive Status: Within Functional Limits for tasks assessed                                 General Comments: grossly assessed and appears [facility]     General Comments  HR 78, sp02 93-95% with pt on RA.  DOE 3/4 - 4/4 with activity and pt fatigues quite quickly with minimal activity    Exercises     Shoulder Instructions      Home Living Family/patient expects to be discharged to:: Private residence Living Arrangements: Alone Available Help at Discharge: Family;Available PRN/intermittently Type of Home: House Home Access: Ramped entrance     Home Layout: One level     Bathroom Shower/Tub: Producer, television/film/video: Standard     Home Equipment: Agricultural consultant (2 wheels);Cane - single point   Additional Comments: son lives close by but works during the day  Lives With: Son    Prior Functioning/Environment Prior Level  of Function : Independent/Modified Independent;Driving             Mobility Comments: ambulates with 2 canes ADLs Comments: manages his own finances, drives, manages medications        OT Problem List: Decreased strength;Decreased activity tolerance;Impaired balance (sitting and/or standing);Decreased knowledge of use of DME or AE;Cardiopulmonary status limiting activity      OT Treatment/Interventions: Self-care/ADL training;Therapeutic exercise;Energy conservation;DME and/or AE instruction;Therapeutic activities;Patient/family education;Balance training    OT Goals(Current goals can be found in the care plan section) Acute Rehab OT Goals Patient Stated Goal: did not state OT Goal Formulation: With patient Time For Goal Achievement: 08/25/21 Potential to Achieve Goals: Good ADL Goals Pt Will Perform Grooming: with modified independence;standing Pt Will Perform Upper Body Bathing: with modified independence;sitting Pt Will Perform Lower Body Bathing: with modified independence;sit to/from stand Pt Will Perform Upper Body Dressing: with modified independence;sitting Pt Will Perform Lower Body Dressing: with modified independence;sit to/from stand Pt Will Transfer to Toilet: with modified independence;ambulating;regular height toilet;bedside commode;grab bars Pt Will Perform Toileting - Clothing Manipulation and hygiene: with modified independence;sit to/from stand  OT Frequency: Min 2X/week   Barriers to  D/C: Decreased caregiver support  Pt reports family only able to assist in the pm as son works during the day       Co-evaluation              AM-PAC OT "6 Clicks" Daily Activity     Outcome Measure Help from another person eating meals?: None Help from another person taking care of personal grooming?: A Little Help from another person toileting, which includes using toliet, bedpan, or urinal?: A Little Help from another person bathing (including washing, rinsing,  drying)?: A Little Help from another person to put on and taking off regular upper body clothing?: A Little Help from another person to put on and taking off regular lower body clothing?: A Little 6 Click Score: 19   End of Session Equipment Utilized During Treatment: Gait belt Nurse Communication: Mobility status  Activity Tolerance: Patient limited by fatigue Patient left: in bed;with call bell/phone within reach  OT Visit Diagnosis: Unsteadiness on feet (R26.81)                Time: [id number] OT Time Calculation (min): 22 min Charges:  OT General Charges $OT Visit: 1 Visit OT Evaluation $OT Eval Moderate Complexity: 1 Mod  Nilsa Nutting., OTR/L Acute Rehabilitation Services Pager [phone] Office [phone]   Lucille Passy M 08/11/2021, 1:47 PM

## 2021-08-11 NOTE — TOC CAGE-AID Note (Signed)
Transition of Care [facility]) - CAGE-AID Screening   Patient Details  Name: Roger Curry MRN: [id number] Date of Birth: 1935/07/28  Transition of Care [facility]) CM/SW Contact:    Mehtab Dolberry C Tarpley-Carter, LCSWA Phone Number: 08/11/2021, 11:37 AM   Clinical Narrative: Pt is unable to participate in Cage Aid.  Kamren Heskett Tarpley-Carter, MSW, LCSW-A Pronouns:  She/Her/Hers [place] Transitions of Care Clinical Social Worker Direct Number:  [phone] [email or web address]  CAGE-AID Screening: Substance Abuse Screening unable to be completed due to: : Patient unable to participate             Substance Abuse Education Offered: No

## 2021-08-11 NOTE — ED Notes (Signed)
ED TO INPATIENT HANDOFF REPORT  ED Nurse Name and Phone #: Cindie Laroche [phone]  S Name/Age/Gender Roger Curry 86 y.o. male Room/Bed: 040C/040C  Code Status   Code Status: Full Code  Home/SNF/Other Home Patient oriented to: self, place, time, and situation Is this baseline? Yes   Triage Complete: Triage complete  Chief Complaint Stroke (cerebrum) [facility]) [I63.9]  Triage Note Pt BIB EMS due to a code stroke. Pt was dropped off at his PCP. Pt experienced weakness in lower legs but does not walk at baseline and difficulty getting speech out. Pt also having SOB and chest pain. Pt is alert but having dysarthria on arrival.    Allergies Allergies  Allergen Reactions   Penicillins Hives and Itching    Childhood allergy   Quinine Derivatives Hives and Itching    Level of Care/Admitting Diagnosis ED Disposition     ED Disposition  Admit   Condition  --   Comment  Hospital Area: [place] [[id number]]  Level of Care: Telemetry Medical [104]  May admit patient to [facility] or [facility] if equivalent level of care is available:: No  Covid Evaluation: Asymptomatic Screening Protocol (No Symptoms)  Diagnosis: Stroke (cerebrum) [facility]) [id number]  Admitting Physician: Gwinda Maine [id number]  Attending Physician: Gwinda Maine [id number]  Estimated length of stay: past midnight tomorrow  Certification:: I certify this patient will need inpatient services for at least 2 midnights          B Medical/Surgery History Past Medical History:  Diagnosis Date   Arthritis    Atrial flutter ([place])    ? s/p ablation at [facility].  references in Ct reprt to variant pullmonary venous anatomy and pending pulmonary venous ablation.in 04/2013   Bladder dysfunction    self  caths 4 x daily.    CAD (coronary artery disease)    CHF (congestive heart failure) ([place])    EF on echo 09/2015 35 to 40%.    Closed angle glaucoma    Electrocution 1970s   Renal stones    Spinal  stenosis    lumbar and cervical.    Type 2 diabetes mellitus ([place])    Past Surgical History:  Procedure Laterality Date   BALLOON ANGIOPLASTY, ARTERY  1992   BILATERAL HEMI SHOULDER ARTHROPLASTY Left    CHOLECYSTECTOMY N/A 10/08/2015   Procedure: LAPAROSCOPIC CHOLECYSTECTOMY;  Surgeon: Georganna Skeans, MD;  Location: [place];  Service: General;  Laterality: N/A;   CORONARY ARTERY BYPASS GRAFT  1996   CYSTOSCOPY     ERCP N/A 10/06/2015   Procedure: ENDOSCOPIC RETROGRADE CHOLANGIOPANCREATOGRAPHY (ERCP);  Surgeon: Milus Banister, MD;  Location: [place];  Service: Endoscopy;  Laterality: N/A;   ESOPHAGOGASTRODUODENOSCOPY  2014   in [place] ICD  2014   [place], 2009   1997 l spine fusion.  2009 lumbar laminectomy, discectomy, fusion.  says he has had total of 4 surgeries.   TOTAL KNEE ARTHROPLASTY Left 12/15/2019   Procedure: TOTAL KNEE ARTHROPLASTY;  Surgeon: Vickey Huger, MD;  Location: [facility];  Service: Orthopedics;  Laterality: Left;     A IV Location/Drains/Wounds Patient Lines/Drains/Airways Status     Active Line/Drains/Airways     Name Placement date Placement time Site Days   Peripheral IV 08/10/21 20 G Left Forearm 08/10/21  1200  Forearm  1   Urethral Catheter urology placed at bedside Non-latex 16 Fr. 08/10/21  1745  Non-latex  1   Incision (Closed) 12/15/19 Knee Left 12/15/19  0914  -- 605            Intake/Output Last 24 hours  Intake/Output Summary (Last 24 hours) at 08/11/2021 1405 Last data filed at 08/11/2021 0730 Gross per 24 hour  Intake --  Output 3500 ml  Net -3500 ml    Labs/Imaging Results for orders placed or performed during the hospital encounter of 08/10/21 (from the past 48 hour(s))  Resp Panel by RT-PCR (Flu A&B, Covid) Nasopharyngeal Swab     Status: None   Collection Time: 08/10/21 12:11 PM   Specimen: Nasopharyngeal Swab; Nasopharyngeal(NP) swabs in vial transport medium   Result Value Ref Range   SARS Coronavirus 2 by RT PCR NEGATIVE NEGATIVE    Comment: (NOTE) SARS-CoV-2 target nucleic acids are NOT DETECTED.  The SARS-CoV-2 RNA is generally detectable in upper respiratory specimens during the acute phase of infection. The lowest concentration of SARS-CoV-2 viral copies this assay can detect is 138 copies/mL. A negative result does not preclude SARS-Cov-2 infection and should not be used as the sole basis for treatment or other patient management decisions. A negative result may occur with  improper specimen collection/handling, submission of specimen other than nasopharyngeal swab, presence of viral mutation(s) within the areas targeted by this assay, and inadequate number of viral copies(<138 copies/mL). A negative result must be combined with clinical observations, patient history, and epidemiological information. The expected result is Negative.  Fact Sheet for Patients:  EntrepreneurPulse.com.au  Fact Sheet for Healthcare Providers:  IncredibleEmployment.be  This test is no t yet approved or cleared by the [place] FDA and  has been authorized for detection and/or diagnosis of SARS-CoV-2 by FDA under an Emergency Use Authorization (EUA). This EUA will remain  in effect (meaning this test can be used) for the duration of the COVID-19 declaration under Section 564(b)(1) of the Act, 21 U.S.C.section 360bbb-3(b)(1), unless the authorization is terminated  or revoked sooner.       Influenza A by PCR NEGATIVE NEGATIVE   Influenza B by PCR NEGATIVE NEGATIVE    Comment: (NOTE) The Xpert Xpress SARS-CoV-2/FLU/RSV plus assay is intended as an aid in the diagnosis of influenza from Nasopharyngeal swab specimens and should not be used as a sole basis for treatment. Nasal washings and aspirates are unacceptable for Xpert Xpress SARS-CoV-2/FLU/RSV testing.  Fact Sheet for  Patients: EntrepreneurPulse.com.au  Fact Sheet for Healthcare Providers: IncredibleEmployment.be  This test is not yet approved or cleared by the [place] FDA and has been authorized for detection and/or diagnosis of SARS-CoV-2 by FDA under an Emergency Use Authorization (EUA). This EUA will remain in effect (meaning this test can be used) for the duration of the COVID-19 declaration under Section 564(b)(1) of the Act, 21 U.S.C. section 360bbb-3(b)(1), unless the authorization is terminated or revoked.  Performed at [facility], [place] [address], [place], [place] [address]   CBG monitoring, ED     Status: Abnormal   Collection Time: 08/10/21 12:13 PM  Result Value Ref Range   Glucose-Capillary 153 (H) 70 - 99 mg/dL    Comment: Glucose reference range applies only to samples taken after fasting for at least 8 hours.  Protime-INR     Status: None   Collection Time: 08/10/21 12:17 PM  Result Value Ref Range   Prothrombin Time 14.5 11.4 - 15.2 seconds   INR 1.1 0.8 - 1.2    Comment: (NOTE) INR goal varies based on  device and disease states. Performed at [facility], [address], [place], [place] [address]   APTT     Status: None   Collection Time: 08/10/21 12:17 PM  Result Value Ref Range   aPTT 36 24 - 36 seconds    Comment: Performed at [facility], [address], [place], [place] [address]  CBC     Status: Abnormal   Collection Time: 08/10/21 12:17 PM  Result Value Ref Range   WBC 9.3 4.0 - 10.5 K/uL   RBC 4.25 4.22 - 5.81 MIL/uL   Hemoglobin 10.8 (L) 13.0 - 17.0 g/dL   HCT 20.2 (L) 54.2 - 70.6 %   MCV 81.9 80.0 - 100.0 fL   MCH 25.4 (L) 26.0 - 34.0 pg   MCHC 31.0 30.0 - 36.0 g/dL   RDW 23.7 (H) 62.8 - 31.5 %   Platelets 167 150 - 400 K/uL   nRBC 0.0 0.0 - 0.2 %    Comment: Performed at [facility], [address] [place], [place] [address]  Differential     Status: None   Collection Time:  08/10/21 12:17 PM  Result Value Ref Range   Neutrophils Relative % 71 %   Neutro Abs 6.6 1.7 - 7.7 K/uL   Lymphocytes Relative 20 %   Lymphs Abs 1.8 0.7 - 4.0 K/uL   Monocytes Relative 6 %   Monocytes Absolute 0.6 0.1 - 1.0 K/uL   Eosinophils Relative 3 %   Eosinophils Absolute 0.3 0.0 - 0.5 K/uL   Basophils Relative 0 %   Basophils Absolute 0.0 0.0 - 0.1 K/uL   Immature Granulocytes 0 %   Abs Immature Granulocytes 0.04 0.00 - 0.07 K/uL    Comment: Performed at [facility], [address] [place], [place] [address]  Comprehensive metabolic panel     Status: Abnormal   Collection Time: 08/10/21 12:17 PM  Result Value Ref Range   Sodium 131 (L) 135 - 145 mmol/L   Potassium 4.5 3.5 - 5.1 mmol/L   Chloride 99 98 - 111 mmol/L   CO2 22 22 - 32 mmol/L   Glucose, Bld 147 (H) 70 - 99 mg/dL    Comment: Glucose reference range applies only to samples taken after fasting for at least 8 hours.   BUN 21 8 - 23 mg/dL   Creatinine, Ser 0.62 0.61 - 1.24 mg/dL   Calcium 8.8 (L) 8.9 - 10.3 mg/dL   Total Protein 7.5 6.5 - 8.1 g/dL   Albumin 3.8 3.5 - 5.0 g/dL   AST 21 15 - 41 U/L   ALT 18 0 - 44 U/L   Alkaline Phosphatase 98 38 - 126 U/L   Total Bilirubin 0.8 0.3 - 1.2 mg/dL   GFR, Estimated >69 >48 mL/min    Comment: (NOTE) Calculated using the CKD-EPI Creatinine Equation (2021)    Anion gap 10 5 - 15    Comment: Performed at [facility], [address], [place], [place] [address]  I-stat chem 8, ED     Status: Abnormal   Collection Time: 08/10/21 12:19 PM  Result Value Ref Range   Sodium 135 135 - 145 mmol/L   Potassium 4.6 3.5 - 5.1 mmol/L   Chloride 100 98 - 111 mmol/L   BUN 24 (H) 8 - 23 mg/dL   Creatinine, Ser 0.35 0.61 - 1.24 mg/dL   Glucose, Bld 009 (H) 70 - 99 mg/dL    Comment: Glucose  reference range applies only to samples taken after fasting for at least 8 hours.   Calcium, Ion 1.12 (L) 1.15 - 1.40 mmol/L   TCO2 25 22 - 32 mmol/L   Hemoglobin 12.6 (L) 13.0 -  17.0 g/dL   HCT 37.0 (L) 39.0 - 52.0 %  Troponin I (High Sensitivity)     Status: None   Collection Time: 08/10/21 12:21 PM  Result Value Ref Range   Troponin I (High Sensitivity) 15 <18 ng/L    Comment: (NOTE) Elevated high sensitivity troponin I (hsTnI) values and significant  changes across serial measurements may suggest ACS but many other  chronic and acute conditions are known to elevate hsTnI results.  Refer to the "Links" section for chest pain algorithms and additional  guidance. Performed at [facility], [place] [address], [place], [place] [address]   Troponin I (High Sensitivity)     Status: Abnormal   Collection Time: 08/10/21  3:21 PM  Result Value Ref Range   Troponin I (High Sensitivity) 18 (H) <18 ng/L    Comment: (NOTE) Elevated high sensitivity troponin I (hsTnI) values and significant  changes across serial measurements may suggest ACS but many other  chronic and acute conditions are known to elevate hsTnI results.  Refer to the "Links" section for chest pain algorithms and additional  guidance. Performed at [facility], [place] [address], [place], [place] [address]   CBG monitoring, ED     Status: Abnormal   Collection Time: 08/10/21  6:40 PM  Result Value Ref Range   Glucose-Capillary 133 (H) 70 - 99 mg/dL    Comment: Glucose reference range applies only to samples taken after fasting for at least 8 hours.  Urinalysis, Routine w reflex microscopic Urine, Catheterized     Status: Abnormal   Collection Time: 08/10/21  8:20 PM  Result Value Ref Range   Color, Urine STRAW (A) YELLOW   APPearance CLEAR CLEAR   Specific Gravity, Urine 1.015 1.005 - 1.030   pH 7.0 5.0 - 8.0   Glucose, UA NEGATIVE NEGATIVE mg/dL   Hgb urine dipstick NEGATIVE NEGATIVE   Bilirubin Urine NEGATIVE NEGATIVE   Ketones, ur NEGATIVE NEGATIVE mg/dL   Protein, ur 100 (A) NEGATIVE mg/dL   Nitrite NEGATIVE NEGATIVE   Leukocytes,Ua NEGATIVE NEGATIVE   RBC / HPF 0-5 0 - 5 RBC/hpf    Bacteria, UA NONE SEEN NONE SEEN   Squamous Epithelial / LPF 0-5 0 - 5    Comment: Performed at [facility], [place] [address] [place], [place] [address]  CBG monitoring, ED     Status: Abnormal   Collection Time: 08/10/21  8:30 PM  Result Value Ref Range   Glucose-Capillary 115 (H) 70 - 99 mg/dL    Comment: Glucose reference range applies only to samples taken after fasting for at least 8 hours.  CBG monitoring, ED     Status: Abnormal   Collection Time: 08/10/21 10:53 PM  Result Value Ref Range   Glucose-Capillary 103 (H) 70 - 99 mg/dL    Comment: Glucose reference range applies only to samples taken after fasting for at least 8 hours.  CBG monitoring, ED     Status: Abnormal   Collection Time: 08/11/21  2:34 AM  Result Value Ref Range   Glucose-Capillary 110 (H) 70 - 99 mg/dL    Comment: Glucose reference range applies only to samples taken after fasting for at least 8 hours.  Hemoglobin A1c  Status: Abnormal   Collection Time: 08/11/21  3:46 AM  Result Value Ref Range   Hgb A1c MFr Bld 6.8 (H) 4.8 - 5.6 %    Comment: (NOTE) Pre diabetes:          5.7%-6.4%  Diabetes:              >6.4%  Glycemic control for   <7.0% adults with diabetes    Mean Plasma Glucose 148.46 mg/dL    Comment: Performed at [place] [address], [place], [place] [address]  Lipid panel     Status: Abnormal   Collection Time: 08/11/21  3:46 AM  Result Value Ref Range   Cholesterol 90 0 - 200 mg/dL   Triglycerides 67 <150 mg/dL   HDL 27 (L) >40 mg/dL   Total CHOL/HDL Ratio 3.3 RATIO   VLDL 13 0 - 40 mg/dL   LDL Cholesterol 50 0 - 99 mg/dL    Comment:        Total Cholesterol/HDL:CHD Risk Coronary Heart Disease Risk Table                     Men   Women  1/2 Average Risk   3.4   3.3  Average Risk       5.0   4.4  2 X Average Risk   9.6   7.1  3 X Average Risk  23.4   11.0        Use the calculated Patient Ratio above and the CHD Risk Table to determine the patient's  CHD Risk.        ATP III CLASSIFICATION (LDL):  <100     mg/dL   Optimal  100-129  mg/dL   Near or Above                    Optimal  130-159  mg/dL   Borderline  160-189  mg/dL   High  >190     mg/dL   Very High Performed at [facility] [address] [place], [place] [address]   Comprehensive metabolic panel     Status: Abnormal   Collection Time: 08/11/21  3:47 AM  Result Value Ref Range   Sodium 134 (L) 135 - 145 mmol/L   Potassium 3.8 3.5 - 5.1 mmol/L    Comment: DELTA CHECK NOTED   Chloride 103 98 - 111 mmol/L   CO2 23 22 - 32 mmol/L   Glucose, Bld 110 (H) 70 - 99 mg/dL    Comment: Glucose reference range applies only to samples taken after fasting for at least 8 hours.   BUN 15 8 - 23 mg/dL   Creatinine, Ser 0.92 0.61 - 1.24 mg/dL   Calcium 8.5 (L) 8.9 - 10.3 mg/dL   Total Protein 6.8 6.5 - 8.1 g/dL   Albumin 3.3 (L) 3.5 - 5.0 g/dL   AST 20 15 - 41 U/L   ALT 16 0 - 44 U/L   Alkaline Phosphatase 93 38 - 126 U/L   Total Bilirubin 1.1 0.3 - 1.2 mg/dL   GFR, Estimated >60 >60 mL/min    Comment: (NOTE) Calculated using the CKD-EPI Creatinine Equation (2021)    Anion gap 8 5 - 15    Comment: Performed at [facility], [place] [address] [place], [place] [address]  CBG monitoring, ED     Status: Abnormal   Collection Time: 08/11/21  8:10 AM  Result Value Ref  Range   Glucose-Capillary 120 (H) 70 - 99 mg/dL    Comment: Glucose reference range applies only to samples taken after fasting for at least 8 hours.  CBG monitoring, ED     Status: Abnormal   Collection Time: 08/11/21 10:46 AM  Result Value Ref Range   Glucose-Capillary 190 (H) 70 - 99 mg/dL    Comment: Glucose reference range applies only to samples taken after fasting for at least 8 hours.  CBG monitoring, ED     Status: Abnormal   Collection Time: 08/11/21 12:08 PM  Result Value Ref Range   Glucose-Capillary 158 (H) 70 - 99 mg/dL    Comment: Glucose reference range applies only to samples taken  after fasting for at least 8 hours.   CT HEAD WO CONTRAST (5MM)  Result Date: 08/11/2021 CLINICAL DATA:  Slurred speech, weakness EXAM: CT HEAD WITHOUT CONTRAST TECHNIQUE: Contiguous axial images were obtained from the base of the skull through the vertex without intravenous contrast. COMPARISON:  CT head dated 1 day prior FINDINGS: Brain: There is no acute intracranial hemorrhage, extra-axial fluid collection, or acute infarct. Parenchymal volume is within normal limits. The ventricles are stable in size. The remote infarcts in the right frontal and occipital lobes are unchanged. There is mild chronic white matter microangiopathy. There is no solid mass lesion.  There is no midline shift. Vascular: There is calcification of the bilateral cavernous ICAs and vertebral arteries. Skull: Normal. Negative for fracture or focal lesion. Sinuses/Orbits: The paranasal sinuses are clear. Bilateral lens implants are in place. The globes and orbits are otherwise unremarkable. Other: None. IMPRESSION: Stable appearance of the brain with no evidence of acute intracranial pathology. No evidence of evolving infarct. Electronically Signed   By: Valetta Mole M.D.   On: 08/11/2021 12:10   CT HEAD WO CONTRAST (5MM)  Result Date: 08/10/2021 CLINICAL DATA:  Stroke, follow-up EXAM: CT HEAD WITHOUT CONTRAST TECHNIQUE: Contiguous axial images were obtained from the base of the skull through the vertex without intravenous contrast. COMPARISON:  Earlier same day FINDINGS: Brain: There is no acute intracranial hemorrhage, mass effect, or edema. No new loss of gray-white differentiation. Small chronic infarcts in the right frontal and occipital lobes again identified. There is no extra-axial fluid collection. Ventricles and sulci are stable in size and configuration. Vascular: Residual contrast prior CTA. Skull: Calvarium is unremarkable. Sinuses/Orbits: No acute finding. Other: None. IMPRESSION: No acute intracranial hemorrhage or  evidence of evolving recent infarction. Electronically Signed   By: Macy Mis M.D.   On: 08/10/2021 15:04   DG Chest Portable 1 View  Result Date: 08/10/2021 CLINICAL DATA:  Pt having chest pain today as well as Code Stroke - hx of CHF, diabetes, flutter, CAD EXAM: PORTABLE CHEST - 1 VIEW COMPARISON:  01/29/2021 FINDINGS: Perihilar and bibasilar interstitial and patchy airspace opacities, left greater than right, new since previous. Mild cardiomegaly. Stable left subclavian AICD. CABG markers. Aortic Atherosclerosis (ICD10-170.0). No effusion. Sternotomy wires. IMPRESSION: Asymmetric perihilar and bibasilar edema or infiltrates, left greater than right, new since previous. Electronically Signed   By: Lucrezia Europe M.D.   On: 08/10/2021 15:30   ECHOCARDIOGRAM COMPLETE  Result Date: 08/11/2021    ECHOCARDIOGRAM REPORT   Patient Name:   SCHNEUR SABER Stipp Date of Exam: 08/11/2021 Medical Rec #:  [id number]      Height:       68.0 in Accession #:    [id number]     Weight:  168.0 lb Date of Birth:  1934/12/18      BSA:          1.898 m Patient Age:    32 years       BP:           152/80 mmHg Patient Gender: M              HR:           71 bpm. Exam Location:  Inpatient Procedure: 2D Echo, Cardiac Doppler, Color Doppler and 3D Echo Indications:    Stroke I63.9  History:        Patient has prior history of Echocardiogram examinations, most                 recent 10/05/2015. CHF.  Sonographer:    Merrie Roof RDCS Referring Phys: [place]  1. Global hypokinesis worse in the septal myocardium. Left ventricular ejection fraction, by estimation, is 35 to 40%. The left ventricle has moderately decreased function. The left ventricle demonstrates global hypokinesis. The left ventricular internal cavity size was moderately to severely dilated. Left ventricular diastolic parameters are consistent with Grade II diastolic dysfunction (pseudonormalization).  2. Right ventricular systolic function  is normal. The right ventricular size is normal. There is mildly elevated pulmonary artery systolic pressure.  3. Left atrial size was severely dilated.  4. Right atrial size was moderately dilated.  5. The mitral valve is degenerative. Mild mitral valve regurgitation. No evidence of mitral stenosis.  6. The aortic valve is tricuspid. Aortic valve regurgitation is not visualized. No aortic stenosis is present.  7. Aortic dilatation noted. There is mild dilatation of the ascending aorta, measuring 36 mm.  8. The inferior vena cava is dilated in size with <50% respiratory variability, suggesting right atrial pressure of 15 mmHg. FINDINGS  Left Ventricle: Global hypokinesis worse in the septal myocardium. Left ventricular ejection fraction, by estimation, is 35 to 40%. The left ventricle has moderately decreased function. The left ventricle demonstrates global hypokinesis. The left ventricular internal cavity size was moderately to severely dilated. There is no left ventricular hypertrophy. Left ventricular diastolic parameters are consistent with Grade II diastolic dysfunction (pseudonormalization). Right Ventricle: The right ventricular size is normal. No increase in right ventricular wall thickness. Right ventricular systolic function is normal. There is mildly elevated pulmonary artery systolic pressure. The tricuspid regurgitant velocity is 2.45  m/s, and with an assumed right atrial pressure of 15 mmHg, the estimated right ventricular systolic pressure is 123XX123 mmHg. Left Atrium: Left atrial size was severely dilated. Right Atrium: Right atrial size was moderately dilated. Pericardium: There is no evidence of pericardial effusion. Mitral Valve: The mitral valve is degenerative in appearance. Mild mitral valve regurgitation. No evidence of mitral valve stenosis. Tricuspid Valve: The tricuspid valve is normal in structure. Tricuspid valve regurgitation is trivial. No evidence of tricuspid stenosis. Aortic Valve: The  aortic valve is tricuspid. Aortic valve regurgitation is not visualized. No aortic stenosis is present. Aortic valve mean gradient measures 4.0 mmHg. Aortic valve peak gradient measures 8.1 mmHg. Aortic valve area, by VTI measures 1.32 cm. Pulmonic Valve: The pulmonic valve was normal in structure. Pulmonic valve regurgitation is mild. No evidence of pulmonic stenosis. Aorta: Aortic dilatation noted. There is mild dilatation of the ascending aorta, measuring 36 mm. Venous: The inferior vena cava is dilated in size with less than 50% respiratory variability, suggesting right atrial pressure of 15 mmHg. IAS/Shunts: No atrial level shunt detected by  color flow Doppler. Additional Comments: A device lead is visualized.  LEFT VENTRICLE PLAX 2D LVIDd:         6.90 cm LVIDs:         5.30 cm LV PW:         0.90 cm LV IVS:        1.00 cm LVOT diam:     2.00 cm   3D Volume EF: LV SV:         43        3D EF:        45 % LV SV Index:   23        LV EDV:       246 ml LVOT Area:     3.14 cm  LV ESV:       134 ml                          LV SV:        112 ml RIGHT VENTRICLE            IVC RV Basal diam:  3.90 cm    IVC diam: 2.30 cm RV S prime:     9.83 cm/s TAPSE (M-mode): 1.6 cm LEFT ATRIUM            Index         RIGHT ATRIUM           Index LA diam:      5.00 cm  2.63 cm/m    RA Area:     23.00 cm LA Vol (A2C): 108.0 ml 56.91 ml/m   RA Volume:   68.40 ml  36.04 ml/m LA Vol (A4C): 195.0 ml 102.75 ml/m  AORTIC VALVE AV Area (Vmax):    1.41 cm AV Area (Vmean):   1.28 cm AV Area (VTI):     1.32 cm AV Vmax:           142.00 cm/s AV Vmean:          97.500 cm/s AV VTI:            0.324 m AV Peak Grad:      8.1 mmHg AV Mean Grad:      4.0 mmHg LVOT Vmax:         63.90 cm/s LVOT Vmean:        39.800 cm/s LVOT VTI:          0.136 m LVOT/AV VTI ratio: 0.42  AORTA Ao Root diam: 3.40 cm Ao Asc diam:  3.60 cm TRICUSPID VALVE TR Peak grad:   24.0 mmHg TR Vmax:        245.00 cm/s  SHUNTS Systemic VTI:  0.14 m Systemic Diam: 2.00  cm Skeet Latch MD Electronically signed by Skeet Latch MD Signature Date/Time: 08/11/2021/12:10:18 PM    Final    CT HEAD CODE STROKE WO CONTRAST  Result Date: 08/10/2021 CLINICAL DATA:  Code stroke.  Neuro deficit, acute, stroke suspected EXAM: CT HEAD WITHOUT CONTRAST TECHNIQUE: Contiguous axial images were obtained from the base of the skull through the vertex without intravenous contrast. COMPARISON:  04/15/2020 FINDINGS: Brain: There is no acute intracranial hemorrhage, mass effect, or edema. Chronic right frontal cortical infarct. New but chronic appearing right occipital infarct. Additional patchy hypoattenuation in the supratentorial white matter is nonspecific but probably reflects similar chronic microvascular ischemic changes. Prominence of the ventricles and sulci reflects similar parenchymal volume loss. No extra-axial collection.  Vascular: No hyperdense vessel. There is intracranial atherosclerotic calcification at the skull base. Skull: Unremarkable. Sinuses/Orbits: No acute abnormality. Other: Mastoid air cells are clear. ASPECTS ([facility] Stroke Program Early CT Score) - Ganglionic level infarction (caudate, lentiform nuclei, internal capsule, insula, M1-M3 cortex): 7 - Supraganglionic infarction (M4-M6 cortex): 3 Total score (0-10 with 10 being normal): 10 IMPRESSION: There is no acute intracranial hemorrhage or evidence of acute infarction. ASPECT score is 10. 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 Chronic microvascular ischemic changes. These results were communicated to Dr. Theda Sers at 12:28 pm on 08/10/2021 by text page via the [facility] messaging system. Electronically Signed   By: Macy Mis M.D.   On: 08/10/2021 12:31   CT ANGIO HEAD NECK W WO CM W PERF (CODE STROKE)  Result Date: 08/10/2021 CLINICAL DATA:  Neuro deficit, acute, stroke suspected; aphasia EXAM: CT ANGIOGRAPHY HEAD AND NECK CT PERFUSION BRAIN TECHNIQUE: Multidetector CT imaging of the head and  neck was performed using the standard protocol during bolus administration of intravenous contrast. Multiplanar CT image reconstructions and MIPs were obtained to evaluate the vascular anatomy. Carotid stenosis measurements (when applicable) are obtained utilizing NASCET criteria, using the distal internal carotid diameter as the denominator. Multiphase CT imaging of the brain was performed following IV bolus contrast injection. Subsequent parametric perfusion maps were calculated using RAPID software. CONTRAST:  153mL OMNIPAQUE IOHEXOL 350 MG/ML SOLN COMPARISON:  None. FINDINGS: CTA NECK Aortic arch: Calcified plaque along the arch and patent great vessel origins. No high-grade proximal subclavian stenosis. Right carotid system: Patent. Mild calcified plaque along the common carotid. Additional calcified plaque along the proximal internal carotid with minimal stenosis. Left carotid system: Patent. Mild calcified plaque along the common carotid. Additional calcified plaque along the proximal internal carotid with less than 50% stenosis. Vertebral arteries: Patent.  Left vertebral is dominant. Skeleton: Cervical spine degenerative changes. Other neck: Unremarkable. Upper chest: Right greater than left pleural effusions. Interstitial edema. Review of the MIP images confirms the above findings CTA HEAD Anterior circulation: Intracranial internal carotid arteries are patent with calcified plaque causing moderate stenosis particularly in the paraclinoid regions. Anterior cerebral arteries are patent. Anterior communicating artery is present. Middle cerebral arteries are patent. Posterior circulation: Intracranial vertebral arteries are patent. Calcified plaque is present on the left mild stenosis. Basilar artery is patent. Major cerebellar artery origins are patent. Posterior cerebral arteries are patent. Venous sinuses: Patent as allowed by contrast bolus timing. Review of the MIP images confirms the above findings CT  Brain Perfusion Findings: CBF (<30%) Volume: 49mL Perfusion (Tmax>6.0s) volume: 83mL Mismatch Volume: 79mL Infarction Location: None. IMPRESSION: No large vessel occlusion, hemodynamically significant stenosis, or evidence of dissection. Perfusion imaging demonstrates no evidence of core infarction penumbra. Mild calcified plaque is present is along the common and proximal internal carotids. Calcified plaque along the intracranial internal carotids causing up to moderate stenosis. Electronically Signed   By: Macy Mis M.D.   On: 08/10/2021 12:57    Pending Labs Unresulted Labs (From admission, onward)    None       Vitals/Pain Today's Vitals   08/11/21 1315 08/11/21 1330 08/11/21 1345 08/11/21 1352  BP:    (!) 106/58  Pulse: 66 61 (!) 59 70  Resp: (!) 25 18 19  (!) 80  Temp:  97.6 F (36.4 C)  98.1 F (36.7 C)  TempSrc:  Oral    SpO2: 93% 97% (!) 89% 96%  Weight:      Height:  PainSc:        Isolation Precautions No active isolations  Medications Medications  acetaminophen (TYLENOL) tablet 650 mg (has no administration in time range)    Or  acetaminophen (TYLENOL) 160 MG/5ML solution 650 mg (has no administration in time range)    Or  acetaminophen (TYLENOL) suppository 650 mg (has no administration in time range)  senna-docusate (Senokot-S) tablet 2 tablet (2 tablets Oral Given 08/10/21 2306)  insulin aspart (novoLOG) injection 0-15 Units (3 Units Subcutaneous Given 08/11/21 1049)  polyethylene glycol (MIRALAX / GLYCOLAX) packet 17 g (17 g Oral Not Given 08/11/21 1218)  pantoprazole (PROTONIX) EC tablet 40 mg (40 mg Oral Given 08/11/21 1218)  carvedilol (COREG) tablet 12.5 mg (12.5 mg Oral Given 08/11/21 1217)  iron polysaccharides (NIFEREX) capsule 150 mg (150 mg Oral Given 08/11/21 1224)  gabapentin (NEURONTIN) capsule 600 mg (600 mg Oral Given 08/11/21 1218)  isosorbide mononitrate (IMDUR) 24 hr tablet 60 mg (60 mg Oral Given 08/11/21 1218)  magnesium oxide  (MAG-OX) tablet 400 mg (400 mg Oral Given 08/11/21 1218)  rosuvastatin (CRESTOR) tablet 10 mg (10 mg Oral Given 08/11/21 1218)  labetalol (NORMODYNE) injection 5-20 mg (has no administration in time range)  apixaban (ELIQUIS) tablet 5 mg (5 mg Oral Given 08/11/21 1229)  sodium chloride flush (NS) 0.9 % injection 3 mL (3 mLs Intravenous Given 08/10/21 1257)  tenecteplase (TNKASE) injection for Stroke 23 mg (23 mg Intravenous Given 08/10/21 1228)  iohexol (OMNIPAQUE) 350 MG/ML injection 100 mL (100 mLs Intravenous Contrast Given 08/10/21 1240)   stroke: mapping our early stages of recovery book ( Does not apply Given 08/10/21 1519)    Mobility walks with device Moderate fall risk   Focused Assessments Neuro Assessment Handoff:  Swallow screen pass? Yes  Cardiac Rhythm: Normal sinus rhythm NIH Stroke Scale ( + Modified Stroke Scale Criteria)  Interval: Other (Comment) Level of Consciousness (1a.)   : Alert, keenly responsive LOC Questions (1b. )   +: Answers both questions correctly LOC Commands (1c. )   + : Performs both tasks correctly Best Gaze (2. )  +: Normal Visual (3. )  +: No visual loss Facial Palsy (4. )    : Normal symmetrical movements Motor Arm, Left (5a. )   +: No drift Motor Arm, Right (5b. )   +: No drift Motor Leg, Left (6a. )   +: No drift Motor Leg, Right (6b. )   +: No drift Limb Ataxia (7. ): Absent Sensory (8. )   +: Normal, no sensory loss Best Language (9. )   +: Mild-to-moderate aphasia Dysarthria (10. ): Mild-to-moderate dysarthria, patient slurs at least some words and, at worst, can be understood with some difficulty Extinction/Inattention (11.)   +: No Abnormality Modified SS Total  +: 1 Complete NIHSS TOTAL: 2 Last date known well: 08/10/21 Last time known well: 1030 (approximate) Neuro Assessment: Exceptions to WDL Neuro Checks:   Initial (08/10/21 1224)  Last Documented NIHSS Modified Score: 1 (08/11/21 1230) Has TPA been given? Yes Temp: 98.1  F (36.7 C) (12/29 1352) Temp Source: Oral (12/29 1330) BP: 106/58 (12/29 1352) Pulse Rate: 70 (12/29 1352) If patient is a Neuro Trauma and patient is going to OR before floor call report to [facility] nurse: [phone] or [phone]   R Recommendations: See Admitting Provider Note  Report given to:   Additional Notes: none

## 2021-08-11 NOTE — ED Notes (Signed)
Pt sleeping. 

## 2021-08-11 NOTE — Progress Notes (Signed)
PT Cancellation Note  Patient Details Name: Roger Curry MRN: [id number] DOB: 03-Apr-1935   Cancelled Treatment:    Reason Eval/Treat Not Completed: Active bedrest order Will follow up as activity orders updated and as schedule allows.   Cindee Salt, DPT  Acute Rehabilitation Services  Pager: [phone] Office: [phone]    Lehman Prom 08/11/2021, 8:46 AM

## 2021-08-11 NOTE — ED Notes (Signed)
Off the floor to MRI at this time
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
## 2021-08-11 NOTE — ED Notes (Signed)
Pt unable to get MRI due to having ICD. Notified stroke MD Thomasena Edis

## 2021-08-12 ENCOUNTER — Other Ambulatory Visit (HOSPITAL_COMMUNITY): Payer: Self-pay

## 2021-08-12 DIAGNOSIS — I5021 Acute systolic (congestive) heart failure: Secondary | ICD-10-CM

## 2021-08-12 DIAGNOSIS — I10 Essential (primary) hypertension: Secondary | ICD-10-CM

## 2021-08-12 LAB — GLUCOSE, CAPILLARY
Glucose-Capillary: 112 mg/dL — ABNORMAL HIGH (ref 70–99)
Glucose-Capillary: 149 mg/dL — ABNORMAL HIGH (ref 70–99)
Glucose-Capillary: 131 mg/dL — ABNORMAL HIGH (ref 70–99)
Glucose-Capillary: 144 mg/dL — ABNORMAL HIGH (ref 70–99)
Glucose-Capillary: 122 mg/dL — ABNORMAL HIGH (ref 70–99)

## 2021-08-12 MED ORDER — APIXABAN 5 MG PO TABS
5.0000 mg | ORAL_TABLET | Freq: Two times a day (BID) | ORAL | 2 refills | Status: AC
  Filled 2021-08-12: qty 60, 30d supply, fill #0

## 2021-08-12 NOTE — Progress Notes (Signed)
Physical Therapy Treatment Patient Details Name: Roger Curry MRN: [id number] DOB: [birth year] Today's Date: 08/12/2021   History of Present Illness This 86 y.o. male presented to ED with acute onset aphasia and some Lt sided weakness.  CTA negative for LVO.  He was given TNK.  He is unable to [facility] MRI.  Repeat head CT negative for acute stroke, however, neurolgy feels symptoms likely due to embolic infarct due to A_fib and pt on anticoagulated.  PMH includes: CAD, CHF, glaucoma, DDD, DM, polyneuropathy, neurogenic bladder with in and out cath, CKD stage 3, s/p Lt TKA.    PT Comments    Pt with improve activity tolerance, balance, and ability to transfer and ambulate. Pt functioning at min guard level and demonstrates safe amb with use of RW. Encouraged pt to use RW versus 2 canes pt reports using at home. Pt with mild SOB and did not report fatigue. SpO2 at 88% on RA s/p amb 120' with RW. Acute PT to cont to follow. Updated d/c recommendations to HHPT with intermittent supervision.    Recommendations for follow up therapy are one component of a multi-disciplinary discharge planning process, led by the attending physician.  Recommendations may be updated based on patient status, additional functional criteria and insurance authorization.  Follow Up Recommendations  Home health PT     Assistance Recommended at Discharge Intermittent Supervision/Assistance  Equipment Recommendations  Rolling walker (2 wheels)    Recommendations for Other Services       Precautions / Restrictions Precautions Precautions: Fall Restrictions Weight Bearing Restrictions: No     Mobility  Bed Mobility               General bed mobility comments: pt up in chair upon PT arrival    Transfers Overall transfer level: Needs assistance   Transfers: Sit to/from Stand Sit to Stand: Min guard           General transfer comment: verbal cues for safe hand placement/not to pull up on walker but  no physical assist required    Ambulation/Gait Ambulation/Gait assistance: Min guard Gait Distance (Feet): 120 Feet Assistive device: Rolling walker (2 wheels) Gait Pattern/deviations: Step-through pattern;Decreased stride length Gait velocity: Decreased     General Gait Details: pt with mild trunk flexion, suspect this is his baseline. Pt with fluid reciprocal gait pattern, no DOE or SOB, pt SpO2 at 88% on RA upon return to room, increased to 92% on RA within 1.5 minutes with deep breathing, pt denies SOB   Stairs             Wheelchair Mobility    Modified Rankin (Stroke Patients Only)       Balance Overall balance assessment: Mild deficits observed, not formally tested                                          Cognition Arousal/Alertness: Awake/alert Behavior During Therapy: WFL for tasks assessed/performed Overall Cognitive Status: Within Functional Limits for tasks assessed                                 General Comments: pt pleasant, engaged in conversation, appreciative of PT services        Exercises      General Comments General comments (skin integrity, edema, etc.): SpO2 88% on RA s/p  amb, inc to 92% s/p 1.5 min      Pertinent Vitals/Pain Pain Assessment: No/denies pain    Home Living                          Prior Function            PT Goals (current goals can now be found in the care plan section) Progress towards PT goals: Progressing toward goals    Frequency    Min 3X/week      PT Plan Discharge plan needs to be updated    Co-evaluation              AM-PAC PT "6 Clicks" Mobility   Outcome Measure  Help needed turning from your back to your side while in a flat bed without using bedrails?: None Help needed moving from lying on your back to sitting on the side of a flat bed without using bedrails?: A Little Help needed moving to and from a bed to a chair (including a  wheelchair)?: A Little Help needed standing up from a chair using your arms (e.g., wheelchair or bedside chair)?: A Little Help needed to walk in hospital room?: A Little Help needed climbing 3-5 steps with a railing? : A Little 6 Click Score: 19    End of Session Equipment Utilized During Treatment: Gait belt Activity Tolerance: Patient limited by fatigue Patient left: in chair;with call bell/phone within reach;with chair alarm set Nurse Communication: Mobility status PT Visit Diagnosis: Unsteadiness on feet (R26.81);Muscle weakness (generalized) (M62.81)     Time: 5361-4431 PT Time Calculation (min) (ACUTE ONLY): 12 min  Charges:  $Gait Training: 8-22 mins                     Lewis Shock, PT, DPT Acute Rehabilitation Services Pager #: [phone] Office #: [phone]    Iona Hansen 08/12/2021, 2:19 PM

## 2021-08-12 NOTE — Progress Notes (Signed)
Pt and daughter in law given stroke education and explained with teach back.  Discharge and medication teaching completed and no questions remained.  Pt. Foley removed and both PIVs removed.

## 2021-08-12 NOTE — Discharge Summary (Signed)
Stroke Discharge Summary  Patient ID: Roger Curry   MRN: [id number]      DOB: Jan 09, 1935  Date of Admission: 08/10/2021 Date of Discharge: 08/12/2021  Attending Physician:  Stroke, Md, MD, Stroke MD Consultant(s):    None  Patient's PCP:  Myrlene Broker, MD  DISCHARGE DIAGNOSIS:  Principal Problem:   Stroke (cerebrum) ([place])   PAF  Secondary diagnosis   CAD/MI s/p stenting and CABG   S/p AICD   DM   HTN   HLD   CHF   Allergies as of 08/12/2021       Reactions   Penicillins Hives, Itching   Childhood allergy   Quinine Derivatives Hives, Itching        Medication List     STOP taking these medications    amLODipine 5 MG tablet Commonly known as: NORVASC   aspirin 325 MG EC tablet   dicyclomine 10 MG capsule Commonly known as: BENTYL   hydrALAZINE 25 MG tablet Commonly known as: APRESOLINE   morphine 15 MG tablet Commonly known as: MSIR   Narcan 4 MG/0.1ML Liqd nasal spray kit Generic drug: naloxone   nitrofurantoin (macrocrystal-monohydrate) 100 MG capsule Commonly known as: MACROBID       TAKE these medications    apixaban 5 MG Tabs tablet Commonly known as: ELIQUIS Take 1 tablet (5 mg total) by mouth 2 (two) times daily.   carvedilol 12.5 MG tablet Commonly known as: COREG Take 12.5 mg by mouth 2 (two) times daily.   cyanocobalamin 1000 MCG/ML injection Commonly known as: (VITAMIN B-12) Inject 1,000 mcg into the muscle every 30 (thirty) days.   Ferrex 150 150 MG capsule Generic drug: iron polysaccharides Take 150 mg by mouth daily.   Fish Oil 1200 MG Caps Take 1 capsule by mouth 2 (two) times daily.   furosemide 40 MG tablet Commonly known as: LASIX Take 40-60 mg by mouth See admin instructions. 60 mg in the morning, and 40 mg in the evening   gabapentin 600 MG tablet Commonly known as: NEURONTIN Take 600 mg by mouth 2 (two) times daily.   isosorbide mononitrate 60 MG 24 hr tablet Commonly known as: IMDUR Take 60  mg by mouth daily.   lisinopril 20 MG tablet Commonly known as: ZESTRIL Take 20 mg by mouth daily.   magnesium oxide 400 (240 Mg) MG tablet Commonly known as: MAG-OX Take 400 mg by mouth daily.   omeprazole 20 MG capsule Commonly known as: PRILOSEC Take 20 mg by mouth daily.   rosuvastatin 20 MG tablet Commonly known as: CRESTOR Take 10 mg by mouth daily.   tamsulosin 0.4 MG Caps capsule Commonly known as: FLOMAX Take 0.4 mg by mouth daily.   True Metrix Blood Glucose Test test strip Generic drug: glucose blood 1 each by Other route as needed (blood sugar).   Trulicity 1.93 XT/0.2IO Sopn Generic drug: Dulaglutide Inject 0.75 mg into the skin every Monday.        LABORATORY STUDIES CBC    Component Value Date/Time   WBC 9.3 08/10/2021 1217   RBC 4.25 08/10/2021 1217   HGB 12.6 (L) 08/10/2021 1219   HCT 37.0 (L) 08/10/2021 1219   PLT 167 08/10/2021 1217   MCV 81.9 08/10/2021 1217   MCH 25.4 (L) 08/10/2021 1217   MCHC 31.0 08/10/2021 1217   RDW 17.2 (H) 08/10/2021 1217   LYMPHSABS 1.8 08/10/2021 1217   MONOABS 0.6 08/10/2021 1217   EOSABS 0.3 08/10/2021 1217  BASOSABS 0.0 08/10/2021 1217   CMP    Component Value Date/Time   NA 134 (L) 08/11/2021 0347   K 3.8 08/11/2021 0347   CL 103 08/11/2021 0347   CO2 23 08/11/2021 0347   GLUCOSE 110 (H) 08/11/2021 0347   BUN 15 08/11/2021 0347   CREATININE 0.92 08/11/2021 0347   CALCIUM 8.5 (L) 08/11/2021 0347   PROT 6.8 08/11/2021 0347   ALBUMIN 3.3 (L) 08/11/2021 0347   AST 20 08/11/2021 0347   ALT 16 08/11/2021 0347   ALKPHOS 93 08/11/2021 0347   BILITOT 1.1 08/11/2021 0347   GFRNONAA >60 08/11/2021 0347   GFRAA >60 01/14/2020 0030   COAGS Lab Results  Component Value Date   INR 1.1 08/10/2021   INR 1.06 10/05/2015   INR 1.0 06/03/2008   Lipid Panel    Component Value Date/Time   CHOL 90 08/11/2021 0346   TRIG 67 08/11/2021 0346   HDL 27 (L) 08/11/2021 0346   CHOLHDL 3.3 08/11/2021 0346   VLDL  13 08/11/2021 0346   LDLCALC 50 08/11/2021 0346   HgbA1C  Lab Results  Component Value Date   HGBA1C 6.8 (H) 08/11/2021   Urinalysis    Component Value Date/Time   COLORURINE STRAW (A) 08/10/2021 2020   APPEARANCEUR CLEAR 08/10/2021 2020   LABSPEC 1.015 08/10/2021 2020   PHURINE 7.0 08/10/2021 2020   GLUCOSEU NEGATIVE 08/10/2021 2020   HGBUR NEGATIVE 08/10/2021 2020   BILIRUBINUR NEGATIVE 08/10/2021 2020   [place] 08/10/2021 2020   PROTEINUR 100 (A) 08/10/2021 2020   UROBILINOGEN 0.2 06/03/2008 1520   NITRITE NEGATIVE 08/10/2021 2020   LEUKOCYTESUR NEGATIVE 08/10/2021 2020   Urine Drug Screen No results found for: LABOPIA, COCAINSCRNUR, LABBENZ, AMPHETMU, THCU, LABBARB  Alcohol Level No results found for: ETH   SIGNIFICANT DIAGNOSTIC STUDIES CT HEAD WO CONTRAST (5MM)  Result Date: 08/11/2021 CLINICAL DATA:  Slurred speech, weakness EXAM: CT HEAD WITHOUT CONTRAST TECHNIQUE: Contiguous axial images were obtained from the base of the skull through the vertex without intravenous contrast. COMPARISON:  CT head dated 1 day prior FINDINGS: Brain: There is no acute intracranial hemorrhage, extra-axial fluid collection, or acute infarct. Parenchymal volume is within normal limits. The ventricles are stable in size. The remote infarcts in the right frontal and occipital lobes are unchanged. There is mild chronic white matter microangiopathy. There is no solid mass lesion.  There is no midline shift. Vascular: There is calcification of the bilateral cavernous ICAs and vertebral arteries. Skull: Normal. Negative for fracture or focal lesion. Sinuses/Orbits: The paranasal sinuses are clear. Bilateral lens implants are in place. The globes and orbits are otherwise unremarkable. Other: None. IMPRESSION: Stable appearance of the brain with no evidence of acute intracranial pathology. No evidence of evolving infarct. Electronically Signed   By: Valetta Mole M.D.   On: 08/11/2021 12:10    CT HEAD WO CONTRAST (5MM)  Result Date: 08/10/2021 CLINICAL DATA:  Stroke, follow-up EXAM: CT HEAD WITHOUT CONTRAST TECHNIQUE: Contiguous axial images were obtained from the base of the skull through the vertex without intravenous contrast. COMPARISON:  Earlier same day FINDINGS: Brain: There is no acute intracranial hemorrhage, mass effect, or edema. No new loss of gray-white differentiation. Small chronic infarcts in the right frontal and occipital lobes again identified. There is no extra-axial fluid collection. Ventricles and sulci are stable in size and configuration. Vascular: Residual contrast prior CTA. Skull: Calvarium is unremarkable. Sinuses/Orbits: No acute finding. Other: None. IMPRESSION: No acute intracranial hemorrhage or evidence  of evolving recent infarction. Electronically Signed   By: Macy Mis M.D.   On: 08/10/2021 15:04   DG Chest Portable 1 View  Result Date: 08/10/2021 CLINICAL DATA:  Pt having chest pain today as well as Code Stroke - hx of CHF, diabetes, flutter, CAD EXAM: PORTABLE CHEST - 1 VIEW COMPARISON:  01/29/2021 FINDINGS: Perihilar and bibasilar interstitial and patchy airspace opacities, left greater than right, new since previous. Mild cardiomegaly. Stable left subclavian AICD. CABG markers. Aortic Atherosclerosis (ICD10-170.0). No effusion. Sternotomy wires. IMPRESSION: Asymmetric perihilar and bibasilar edema or infiltrates, left greater than right, new since previous. Electronically Signed   By: Lucrezia Europe M.D.   On: 08/10/2021 15:30   ECHOCARDIOGRAM COMPLETE  Result Date: 08/11/2021    ECHOCARDIOGRAM REPORT   Patient Name:   Roger Curry Date of Exam: 08/11/2021 Medical Rec #:  [id number]      Height:       68.0 in Accession #:    [id number]     Weight:       168.0 lb Date of Birth:  1934/11/10      BSA:          1.898 m Patient Age:    12 years       BP:           152/80 mmHg Patient Gender: M              HR:           71 bpm. Exam Location:  Inpatient  Procedure: 2D Echo, Cardiac Doppler, Color Doppler and 3D Echo Indications:    Stroke I63.9  History:        Patient has prior history of Echocardiogram examinations, most                 recent 10/05/2015. CHF.  Sonographer:    Merrie Roof RDCS Referring Phys: [place]  1. Global hypokinesis worse in the septal myocardium. Left ventricular ejection fraction, by estimation, is 35 to 40%. The left ventricle has moderately decreased function. The left ventricle demonstrates global hypokinesis. The left ventricular internal cavity size was moderately to severely dilated. Left ventricular diastolic parameters are consistent with Grade II diastolic dysfunction (pseudonormalization).  2. Right ventricular systolic function is normal. The right ventricular size is normal. There is mildly elevated pulmonary artery systolic pressure.  3. Left atrial size was severely dilated.  4. Right atrial size was moderately dilated.  5. The mitral valve is degenerative. Mild mitral valve regurgitation. No evidence of mitral stenosis.  6. The aortic valve is tricuspid. Aortic valve regurgitation is not visualized. No aortic stenosis is present.  7. Aortic dilatation noted. There is mild dilatation of the ascending aorta, measuring 36 mm.  8. The inferior vena cava is dilated in size with <50% respiratory variability, suggesting right atrial pressure of 15 mmHg. FINDINGS  Left Ventricle: Global hypokinesis worse in the septal myocardium. Left ventricular ejection fraction, by estimation, is 35 to 40%. The left ventricle has moderately decreased function. The left ventricle demonstrates global hypokinesis. The left ventricular internal cavity size was moderately to severely dilated. There is no left ventricular hypertrophy. Left ventricular diastolic parameters are consistent with Grade II diastolic dysfunction (pseudonormalization). Right Ventricle: The right ventricular size is normal. No increase in right  ventricular wall thickness. Right ventricular systolic function is normal. There is mildly elevated pulmonary artery systolic pressure. The tricuspid regurgitant velocity is 2.45  m/s, and with an assumed  right atrial pressure of 15 mmHg, the estimated right ventricular systolic pressure is 32.3 mmHg. Left Atrium: Left atrial size was severely dilated. Right Atrium: Right atrial size was moderately dilated. Pericardium: There is no evidence of pericardial effusion. Mitral Valve: The mitral valve is degenerative in appearance. Mild mitral valve regurgitation. No evidence of mitral valve stenosis. Tricuspid Valve: The tricuspid valve is normal in structure. Tricuspid valve regurgitation is trivial. No evidence of tricuspid stenosis. Aortic Valve: The aortic valve is tricuspid. Aortic valve regurgitation is not visualized. No aortic stenosis is present. Aortic valve mean gradient measures 4.0 mmHg. Aortic valve peak gradient measures 8.1 mmHg. Aortic valve area, by VTI measures 1.32 cm. Pulmonic Valve: The pulmonic valve was normal in structure. Pulmonic valve regurgitation is mild. No evidence of pulmonic stenosis. Aorta: Aortic dilatation noted. There is mild dilatation of the ascending aorta, measuring 36 mm. Venous: The inferior vena cava is dilated in size with less than 50% respiratory variability, suggesting right atrial pressure of 15 mmHg. IAS/Shunts: No atrial level shunt detected by color flow Doppler. Additional Comments: A device lead is visualized.  LEFT VENTRICLE PLAX 2D LVIDd:         6.90 cm LVIDs:         5.30 cm LV PW:         0.90 cm LV IVS:        1.00 cm LVOT diam:     2.00 cm   3D Volume EF: LV SV:         43        3D EF:        45 % LV SV Index:   23        LV EDV:       246 ml LVOT Area:     3.14 cm  LV ESV:       134 ml                          LV SV:        112 ml RIGHT VENTRICLE            IVC RV Basal diam:  3.90 cm    IVC diam: 2.30 cm RV S prime:     9.83 cm/s TAPSE (M-mode): 1.6 cm  LEFT ATRIUM            Index         RIGHT ATRIUM           Index LA diam:      5.00 cm  2.63 cm/m    RA Area:     23.00 cm LA Vol (A2C): 108.0 ml 56.91 ml/m   RA Volume:   68.40 ml  36.04 ml/m LA Vol (A4C): 195.0 ml 102.75 ml/m  AORTIC VALVE AV Area (Vmax):    1.41 cm AV Area (Vmean):   1.28 cm AV Area (VTI):     1.32 cm AV Vmax:           142.00 cm/s AV Vmean:          97.500 cm/s AV VTI:            0.324 m AV Peak Grad:      8.1 mmHg AV Mean Grad:      4.0 mmHg LVOT Vmax:         63.90 cm/s LVOT Vmean:        39.800 cm/s LVOT VTI:  0.136 m LVOT/AV VTI ratio: 0.42  AORTA Ao Root diam: 3.40 cm Ao Asc diam:  3.60 cm TRICUSPID VALVE TR Peak grad:   24.0 mmHg TR Vmax:        245.00 cm/s  SHUNTS Systemic VTI:  0.14 m Systemic Diam: 2.00 cm Skeet Latch MD Electronically signed by Skeet Latch MD Signature Date/Time: 08/11/2021/12:10:18 PM    Final    CT HEAD CODE STROKE WO CONTRAST  Result Date: 08/10/2021 CLINICAL DATA:  Code stroke.  Neuro deficit, acute, stroke suspected EXAM: CT HEAD WITHOUT CONTRAST TECHNIQUE: Contiguous axial images were obtained from the base of the skull through the vertex without intravenous contrast. COMPARISON:  04/15/2020 FINDINGS: Brain: There is no acute intracranial hemorrhage, mass effect, or edema. Chronic right frontal cortical infarct. New but chronic appearing right occipital infarct. Additional patchy hypoattenuation in the supratentorial white matter is nonspecific but probably reflects similar chronic microvascular ischemic changes. Prominence of the ventricles and sulci reflects similar parenchymal volume loss. No extra-axial collection. Vascular: No hyperdense vessel. There is intracranial atherosclerotic calcification at the skull base. Skull: Unremarkable. Sinuses/Orbits: No acute abnormality. Other: Mastoid air cells are clear. ASPECTS ([place] Stroke Program Early CT Score) - Ganglionic level infarction (caudate, lentiform nuclei, internal  capsule, insula, M1-M3 cortex): 7 - Supraganglionic infarction (M4-M6 cortex): 3 Total score (0-10 with 10 being normal): 10 IMPRESSION: There is no acute intracranial hemorrhage or evidence of acute infarction. ASPECT score is 10. 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 Chronic microvascular ischemic changes. These results were communicated to Dr. Theda Sers at 12:28 pm on 08/10/2021 by text page via the [facility] messaging system. Electronically Signed   By: Macy Mis M.D.   On: 08/10/2021 12:31   CT ANGIO HEAD NECK W WO CM W PERF (CODE STROKE)  Result Date: 08/10/2021 CLINICAL DATA:  Neuro deficit, acute, stroke suspected; aphasia EXAM: CT ANGIOGRAPHY HEAD AND NECK CT PERFUSION BRAIN TECHNIQUE: Multidetector CT imaging of the head and neck was performed using the standard protocol during bolus administration of intravenous contrast. Multiplanar CT image reconstructions and MIPs were obtained to evaluate the vascular anatomy. Carotid stenosis measurements (when applicable) are obtained utilizing NASCET criteria, using the distal internal carotid diameter as the denominator. Multiphase CT imaging of the brain was performed following IV bolus contrast injection. Subsequent parametric perfusion maps were calculated using RAPID software. CONTRAST:  199m OMNIPAQUE IOHEXOL 350 MG/ML SOLN COMPARISON:  None. FINDINGS: CTA NECK Aortic arch: Calcified plaque along the arch and patent great vessel origins. No high-grade proximal subclavian stenosis. Right carotid system: Patent. Mild calcified plaque along the common carotid. Additional calcified plaque along the proximal internal carotid with minimal stenosis. Left carotid system: Patent. Mild calcified plaque along the common carotid. Additional calcified plaque along the proximal internal carotid with less than 50% stenosis. Vertebral arteries: Patent.  Left vertebral is dominant. Skeleton: Cervical spine degenerative changes. Other neck: Unremarkable.  Upper chest: Right greater than left pleural effusions. Interstitial edema. Review of the MIP images confirms the above findings CTA HEAD Anterior circulation: Intracranial internal carotid arteries are patent with calcified plaque causing moderate stenosis particularly in the paraclinoid regions. Anterior cerebral arteries are patent. Anterior communicating artery is present. Middle cerebral arteries are patent. Posterior circulation: Intracranial vertebral arteries are patent. Calcified plaque is present on the left mild stenosis. Basilar artery is patent. Major cerebellar artery origins are patent. Posterior cerebral arteries are patent. Venous sinuses: Patent as allowed by contrast bolus timing. Review of the MIP images confirms the  above findings CT Brain Perfusion Findings: CBF (<30%) Volume: 28m Perfusion (Tmax>6.0s) volume: 0636mMismatch Volume: 36m22mnfarction Location: None. IMPRESSION: No large vessel occlusion, hemodynamically significant stenosis, or evidence of dissection. Perfusion imaging demonstrates no evidence of core infarction penumbra. Mild calcified plaque is present is along the common and proximal internal carotids. Calcified plaque along the intracranial internal carotids causing up to moderate stenosis. Electronically Signed   By: PraMacy MisD.   On: 08/10/2021 12:57      HISTORY OF PRESENT ILLNESS Roger Curry a 86 1o. male with a PMHx of Aflutter, CAD, CHF, glaucoma, DDD, DM II, polyneuropathy, neurogenic bladder with in and out caths, and CKD III  who presented via EMS as a code stroke. Patient was at his cardiology appointment this am when he developed acute aphasia and some left sided weakness acutely at 1030. EMS activated code stroke and patient was brought to ED.    Upon arrival, a brief exam was conducted for airway clearance at ED bridge and patient was taken emergently to CT suite. CTH was without acute finding. No LVO on CTA. Due to his deficit of aphasia, among  others, decision was made to give TNK.    In CT suite, he had no receptive aphasia, but notable expressive aphasia. He hesitated to get his words out, but NP could tell that he was mouthing correct word. When asked his age, he said [id number] a few seconds then held up 5 fingers for 87. He was able to identify key after a few seconds.    NP reviewed chart back to 2012 and found no neurology notes/visits.    After imaging, patient was taken back to ED and will be admitted by us Koreae to TNK administration.    LKW: 1030 TNK given? Yes at 1228. (Brief delay due to cycling BP x 2).  IR Thrombectomy? No, no LVO MRS:1   HOSPITAL COURSE Roger Curry a 86 92o. male with history of PAF and Aflutter s/p ablation, CAD/MI s/p stenting and AICD, CHF, DM, CKD III admitted for aphasia and left sided weakness. TNK given around 12pm 08/10/21.     Possible small stroke:  CT repeat neg, not able to do MRI. Stroke symptoms and exam not quite consistent on documentation, but likely due to embolic secondary to afib not on [facility] and [facility] (in Dr. FitOla Spurrfice, pt had slurry speech and b/l UE weakness but no focal deficit. In ER with Dr. ColTheda Serse documented LUE and LLE weakness, 2 each on NIHSS. On my exam today, he had no focal deficit except right nasolabial fold flattening).  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 CTA head and neck, no LVO, athero at b/l ICA siphon and bulbs MRI not able to do due to AICD CT repeat no acute finding 2D Echo EF 35-40% LDL 50 HgbA1c 6.8 SCDs for VTE prophylaxis aspirin 325 mg daily prior to admission, now on eliquis 5mg32md Ongoing aggressive stroke risk factor management Therapy recommendations:  HH PT/OT Disposition: home today   Afib and aflutter CAD/MI s/p stenting and CABG S/p AICD Cardiac hx complicated  Follows with Dr. FolkOtho Perl. [facility] FolkSudie Grumblinge in 12/2020 " The  story I been able to piece together is that sometime around 1996 or 1997 he was having significant back problems and back surgery was being contemplated. He saw physician for preoperative cardiac assessment and must  of had abnormal stress test. Underwent cardiac catheterization with stenting. He then had his back surgery and did well up until 1998 when he had some recurrent chest pain and may have had an MI. He underwent cardiac catheterization at that time revealing recurrent multivessel coronary disease and he underwent coronary artery bypass grafting. He was left with an ischemic dilated cardiomyopathy and and apparently had some problems with atrial arrhythmias over the years. At some point, he had a flutter ablation performed and then at some point had a defibrillator placed. He is had follow-up visits in our device clinic and sees Dr. Ola Spurr annually in follow-up of his arrhythmias. He had not seen a general cardiologist until my visit with him in December 2021 after the departure of Dr. Bettina Gavia from our group". Per Dr. Ola Spurr not in 04/2021 "Status post ablation of atrial flutter-no atrial arrhythmias detected using the device as a monitor." Pt saw Dr. Ola Spurr 08/10/21 (yesterday) for recent onset of afib and discussing about AC. Pt developed slurry speech ang b/l UE weakness, no focal deficit. Code stroke activation and he received TNK in ER AICD interrogation showed persistent afib from 06/26/2021 until 08/09/21. AICD also showed possible afib in 12/2020 but more shorter duration. Agree with Dr. Ola Spurr, put on eliquis 46m bid   Diabetes HgbA1c 6.8 goal < 7.0 Controlled CBG monitoring SSI DM education and close PCP follow up   Hypertension Stable on the high end BP goal < 180/105 post TNK Resume home coreg and imdur Gradually resume other home BP meds Long term BP goal normotensive   Hyperlipidemia Home meds:  crestor 10  LDL 50, goal < 70 Now on crestor 10 No high  intensity give advance age and LDL at goal Continue statin at discharge   Other Stroke Risk Factors Advanced age CHF   Other Active Problems     DISCHARGE EXAM Blood pressure (!) 114/52, pulse (!) 58, temperature 98.2 F (36.8 C), temperature source Oral, resp. rate 18, height 5' 8"  (1.727 m), weight 76.2 kg, SpO2 93 %.  General - Well nourished, well developed, in no apparent distress.   Ophthalmologic - fundi not visualized due to noncooperation.   Cardiovascular - Regular rhythm and rate, not in afib now.   Neuro - awake, alert, eyes open, orientated to place, time, age. No aphasia, mild dysarthria, stuttering much improved from yesterday, following all simple commands. Able to name and repeat in mildly dysarthric voice. No gaze palsy, tracking bilaterally, visual field full, PERRL. Right nasolabial fold flatterning. Tongue midline. Bilateral UEs 4/5, no drift. Bilaterally LEs 3/5. Sensation symmetrical bilaterally, b/l FTN intact, gait not tested.     Discharge Diet       Diet   Diet heart healthy/carb modified Room service appropriate? Yes; Fluid consistency: Thin   liquids  DISCHARGE PLAN [facility]  [facility] Eli[facility] (apixaban) [facility] for secondary stroke prevention. Ongoing stroke risk factor control by Primary Care Physician at time of discharge Follow-up PCP RMyrlene Broker MD in 2 weeks. [facility], office to schedule an appointment.   40 minutes were spent preparing discharge.  JRosalin Hawking MD PhD Stroke Neurology 08/12/2021 3:35 PM

## 2021-08-12 NOTE — TOC Transition Note (Signed)
Transition of Care [facility]) - CM/SW Discharge Note   Patient Details  Name: Roger Curry MRN: [id number] Date of Birth: Oct 18, 1934  Transition of Care [facility]) CM/SW Contact:  [name], RN Phone Number: 08/12/2021, 3:21 PM   Clinical Narrative:    Patient discharging home with home health services. Information on the AVS. Family to provide transport home.  Pt has 30 day Eliquis card.    Final next level of care: Home w Home Health Services Barriers to Discharge: No Barriers Identified   Patient Goals and CMS Choice   CMS Medicare.gov Compare Post Acute Care list provided to:: Patient Choice offered to / list presented to : Patient  Discharge Placement                       Discharge Plan and Services   Discharge Planning Services: CM Consult Post Acute Care Choice: Home Health                    HH Arranged: PT, OT [facility] Agency: [facility] Date [facility] Agency Contacted: 08/12/21   Representative spoke with at [facility] Agency: Kandee Keen  Social Determinants of Health (SDOH) Interventions     Readmission Risk Interventions No flowsheet data found.

## 2021-08-12 NOTE — Progress Notes (Signed)
Occupational Therapy Treatment Patient Details Name: Roger Curry MRN: [id number] DOB: 1935-06-30 Today's Date: 08/12/2021   History of present illness This 86 y.o. male presented to ED with acute onset aphasia and some Lt sided weakness.  CTA negative for LVO.  He was given TNK.  He is unable to [facility] MRI.  Repeat head CT negative for acute stroke, however, neurolgy feels symptoms likely due to embolic infarct due to A_fib and pt on anticoagulated.  PMH includes: CAD, CHF, glaucoma, DDD, DM, polyneuropathy, neurogenic bladder with in and out cath, CKD stage 3, s/p Lt TKA.   OT comments  Pt improved this date and appears to be approaching baseline.  He required min guard, progressing to supervision with ADLs and functional transfers.  Discharge rec changed to [facility] with intermittent supervision.  Will continue to follow    Recommendations for follow up therapy are one component of a multi-disciplinary discharge planning process, led by the attending physician.  Recommendations may be updated based on patient status, additional functional criteria and insurance authorization.    Follow Up Recommendations  Home health OT    Assistance Recommended at Discharge Intermittent Supervision/Assistance  Equipment Recommendations   (Pt does not want BSC/3in1.  He reports he has grab bar in front of toilet)    Recommendations for Other Services      Precautions / Restrictions Precautions Precautions: Fall Restrictions Weight Bearing Restrictions: No       Mobility Bed Mobility Overal bed mobility: Modified Independent (with use of rails)                  Transfers                         Balance Overall balance assessment: Mild deficits observed, not formally tested                                         ADL either performed or assessed with clinical judgement   ADL Overall ADL's : Needs assistance/impaired Eating/Feeding: Modified independent;Bed  level   Grooming: Wash/dry face;Wash/dry hands;Brushing hair;Supervision/safety;Sitting   Upper Body Bathing: Set up;Supervision/ safety;Sitting   Lower Body Bathing: Min guard;Sit to/from stand       Lower Body Dressing: Min guard;Sit to/from stand   Toilet Transfer: Min guard;Ambulation;Comfort height toilet;Rolling walker (2 wheels);Grab bars   Toileting- Clothing Manipulation and Hygiene: Min guard;Sit to/from stand       Functional mobility during ADLs: Min guard;Rolling walker (2 wheels) General ADL Comments: min guard progressing to supervision with ADLs    Extremity/Trunk Assessment Upper Extremity Assessment Upper Extremity Assessment: Overall WFL for tasks assessed   Lower Extremity Assessment Lower Extremity Assessment: Defer to PT evaluation        Vision   Vision Assessment?: No apparent visual deficits   Perception     Praxis      Cognition Arousal/Alertness: Awake/alert Behavior During Therapy: WFL for tasks assessed/performed Overall Cognitive Status: Within Functional Limits for tasks assessed                                 General Comments: grossly assessed and appears [facility]          Exercises     Shoulder Instructions       General Comments DOE 2/4  Pertinent Vitals/ Pain       Pain Assessment: Faces Faces Pain Scale: No hurt  Home Living                                          Prior Functioning/Environment              Frequency  Min 2X/week        Progress Toward Goals  OT Goals(current goals can now be found in the care plan section)  Progress towards OT goals: Progressing toward goals     Plan Discharge plan needs to be updated;Equipment recommendations need to be updated    Co-evaluation                 AM-PAC OT "6 Clicks" Daily Activity     Outcome Measure   Help from another person eating meals?: None Help from another person taking care of personal  grooming?: A Little Help from another person toileting, which includes using toliet, bedpan, or urinal?: A Little Help from another person bathing (including washing, rinsing, drying)?: A Little Help from another person to put on and taking off regular upper body clothing?: A Little Help from another person to put on and taking off regular lower body clothing?: A Little 6 Click Score: 19    End of Session Equipment Utilized During Treatment: Gait belt  OT Visit Diagnosis: Unsteadiness on feet (R26.81)   Activity Tolerance Patient tolerated treatment well   Patient Left in chair;with call bell/phone within reach;with chair alarm set   Nurse Communication Mobility status        Time: [phone] OT Time Calculation (min): 35 min  Charges: OT General Charges $OT Visit: 1 Visit OT Treatments $Self Care/Home Management : 23-37 mins  Eber Jones., OTR/L Acute Rehabilitation Services Pager [phone] Office [phone]   Jeani Hawking M 08/12/2021, 9:30 AM

## 2021-08-12 NOTE — TOC Initial Note (Signed)
Transition of Care [facility]) - Initial/Assessment Note    Patient Details  Name: Roger Curry MRN: [id number] Date of Birth: October 21, 1934  Transition of Care [facility]) CM/SW Contact:    Pollie Friar, RN Phone Number: 08/12/2021, 2:47 PM  Clinical Narrative:                 Pt lives home alone but son and DIL live very close by and can check in on the patient.  He still drives and does his own home medications and denies any issues.  Recommendations for home health services. Pt without a preference. CM has arranged [place] with [facility]. Information on the AVS. Pt will have transport home when medically ready.  CM provided him with 30 day free card for Eliquis. Pt states his DIL can assist him with cost after first 30 days if needed.   Expected Discharge Plan: [place] Barriers to Discharge: Continued Medical Work up   Patient Goals and CMS Choice   CMS Medicare.gov Compare Post Acute Care list provided to:: Patient Choice offered to / list presented to : Patient  Expected Discharge Plan and Services Expected Discharge Plan: [place]   Discharge Planning Services: CM Consult Post Acute Care Choice: [place] arrangements for the past 2 months: Single Family Home                           HH Arranged: PT, OT HH Agency: [place] Date [facility] Agency Contacted: 08/12/21   Representative spoke with at [place]: Tommi Rumps  Prior Living Arrangements/Services Living arrangements for the past 2 months: [place] Lives with:: Self Patient language and need for interpreter reviewed:: Yes Do you feel safe going back to the place where you live?: Yes          Current home services: DME (walker/ canes/ 3 in 1) Criminal Activity/Legal Involvement Pertinent to Current Situation/Hospitalization: No - Comment as needed  Activities of Daily Living Home Assistive Devices/Equipment: None ADL Screening (condition at time of  admission) Patient's cognitive ability adequate to safely complete daily activities?: Yes Is the patient deaf or have difficulty hearing?: No Does the patient have difficulty seeing, even when wearing glasses/contacts?: No Does the patient have difficulty concentrating, remembering, or making decisions?: No Patient able to express need for assistance with ADLs?: Yes Does the patient have difficulty dressing or bathing?: Yes Independently performs ADLs?: No Communication: Independent Dressing (OT): Needs assistance Is this a change from baseline?: Change from baseline, expected to last >3 days Grooming: Needs assistance Is this a change from baseline?: Change from baseline, expected to last >3 days Feeding: Independent Bathing: Needs assistance Is this a change from baseline?: Change from baseline, expected to last >3 days Toileting: Needs assistance Is this a change from baseline?: Change from baseline, expected to last >3days In/Out Bed: Needs assistance Is this a change from baseline?: Change from baseline, expected to last >3 days Walks in Home: Needs assistance Is this a change from baseline?: Change from baseline, expected to last >3 days Does the patient have difficulty walking or climbing stairs?: Yes Weakness of Legs: Both Weakness of Arms/Hands: Both  Permission Sought/Granted                  Emotional Assessment Appearance:: Appears stated age Attitude/Demeanor/Rapport: Engaged Affect (typically observed): Accepting Orientation: : Oriented to Self, Oriented to Place, Oriented to  Time,  Oriented to Situation   Psych Involvement: No (comment)  Admission diagnosis:  Aphasia [R47.01] Stroke (cerebrum) (HCC) [I63.9] Patient Active Problem List   Diagnosis Date Noted   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/2021   S/P total knee replacement 12/15/2019   Chronic pain of left knee 10/16/2019   Paroxysmal atrial fibrillation (HCC) 09/03/2019   Suprapubic abdominal pain  06/30/2019   Entrapment of right ulnar nerve 05/14/2019   Tinnitus of both ears 01/27/2019   Cutaneous abscess of buttock 10/30/2018   Mixed dyslipidemia 04/02/2017   Cervical radiculopathy 09/28/2016   Acute pain of right shoulder 09/28/2016   H/O prior ablation treatment 08/15/2016   Ischemic cardiomyopathy 10/28/2015   Transaminitis 10/09/2015   Systolic CHF, chronic (HCC) 10/09/2015   Acute gallstone pancreatitis 10/05/2015   Diabetes mellitus type 2, controlled (HCC) 10/05/2015   Essential hypertension 10/05/2015   CAD (coronary artery disease) 10/05/2015   Normocytic normochromic anemia 10/05/2015   S/P CABG (coronary artery bypass graft) 10/04/2015   Polyneuropathy in diseases classified elsewhere (HCC) 10/04/2015   Neurogenic bladder 10/04/2015   Multilevel degenerative disc disease 10/04/2015   Mitral valve disorder 10/04/2015   Hyperlipidemia 10/04/2015   CKD stage 3 secondary to diabetes (HCC) 10/04/2015   Cardiomyopathy (HCC) 10/04/2015   Benign prostatic hyperplasia 10/04/2015   B12 deficiency 10/04/2015   Acid reflux 10/04/2015   Presence of biventricular AICD 03/11/2015   Bilateral carpal tunnel syndrome 10/26/2014   Spinal stenosis of lumbar region 01/26/2014   Diabetic neuropathy (HCC) 01/26/2014   Post laminectomy syndrome 01/26/2014   Urinary calculus 11/10/2013   Displacement of lumbar intervertebral disc 11/10/2013   DDD (degenerative disc disease), lumbosacral 11/10/2013   Congestive heart failure (HCC) 11/10/2013   PCP:  Hadley Pen, MD Pharmacy:   [facility] - [place], [place] - [address] [place] [address] Phone: [phone] Fax: [phone]  [facility] [address] [place] - [address] [place] [address] Phone: [phone] Fax: [phone]     Social Determinants of Health (SDOH) Interventions    Readmission Risk Interventions No flowsheet  data found.

## 2021-08-12 NOTE — Discharge Instructions (Addendum)
Eliquis (apixaban) 5mg  twice a day for secondary stroke prevention. Continue crestor for cholesterol management Continue BP management with BP meds. Your BP is not low in hospital, so we stopped some BP meds, please refer to the discharge paper for BP meds.  Ongoing stroke risk factor control by Primary Care Physician  Follow-up PCP , MD in 2 weeks. Follow up with Dr. Hadley Pen at [facility] Follow-up in [facility] in 4 weeks, office to schedule an appointment.   Information on my medicine - ELIQUIS (apixaban)  This medication education was reviewed with me or my healthcare representative as part of my discharge preparation.  Why was Eliquis prescribed for you? Eliquis was prescribed for you to reduce the risk of a blood clot forming that can cause a stroke if you have a medical condition called atrial fibrillation (a type of irregular heartbeat).  What do You need to know about Eliquis ? Take your Eliquis TWICE DAILY - one tablet in the morning and one tablet in the evening with or without food. If you have difficulty swallowing the tablet whole please discuss with your pharmacist how to take the medication safely.  Take Eliquis exactly as prescribed by your doctor and DO NOT stop taking Eliquis without talking to the doctor who prescribed the medication.  Stopping may increase your risk of developing a stroke.  Refill your prescription before you run out.  After discharge, you should have regular check-up appointments with your healthcare provider that is prescribing your Eliquis.  In the future your dose may need to be changed if your kidney function or weight changes by a significant amount or as you get older.  What do you do if you miss a dose? If you miss a dose, take it as soon as you remember on the same day and resume taking twice daily.  Do not take more than one dose of ELIQUIS at the same time to make up a missed  dose.  Important Safety Information A possible side effect of Eliquis is bleeding. You should call your healthcare provider right away if you experience any of the following: Bleeding from an injury or your nose that does not stop. Unusual colored urine (red or dark brown) or unusual colored stools (red or black). Unusual bruising for unknown reasons. A serious fall or if you hit your head (even if there is no bleeding).  Some medicines may interact with Eliquis and might increase your risk of bleeding or clotting while on Eliquis. To help avoid this, consult your healthcare provider or pharmacist prior to using any new prescription or non-prescription medications, including herbals, vitamins, non-steroidal anti-inflammatory drugs (NSAIDs) and supplements.  This website has more information on Eliquis (apixaban): http://www.eliquis.com/eliquis/home

## 2021-08-12 NOTE — TOC Benefit Eligibility Note (Signed)
Patient Advocate Encounter  Insurance verification completed.    The patient is currently admitted and upon discharge could be taking Eliquis 5 mg.  The current 30 day co-pay is, $45.00.   The patient is insured through [employer] Medicare Part D     Joushua Dugar, CPhT Pharmacy Patient Advocate Specialist [place] [facility] Patient Advocate Team Direct Number: [phone]  Fax: [phone]        

## 2021-08-22 ENCOUNTER — Other Ambulatory Visit (HOSPITAL_COMMUNITY): Payer: Self-pay

## 2021-08-22 ENCOUNTER — Telehealth (HOSPITAL_COMMUNITY): Payer: Self-pay | Admitting: Pharmacist

## 2021-08-22 NOTE — Telephone Encounter (Signed)
Pharmacy Transitions of Care Follow-up Telephone Call  Date of discharge: 08/12/21  Discharge Diagnosis: stroke, possibly due to a.fib, need for anticoag  How have you been since you were released from the hospital?    Medication changes made at discharge:  - START: Eliquis  - STOPPED:  amLODipine 5 MG tablet (NORVASC)  aspirin 325 MG EC tablet  dicyclomine 10 MG capsule (BENTYL)  hydrALAZINE 25 MG tablet (APRESOLINE)  morphine 15 MG tablet (MSIR)  Narcan 4 MG/0.1ML Liqd nasal spray kit (naloxone)  nitrofurantoin (macrocrystal-monohydrate) 100 MG capsule (MACROBID)   - CHANGED: n/a  Medication changes verified by the patient? Yes    Medication Accessibility:  Home Pharmacy: [facility]   Was the patient provided with refills on discharged medications? yes   Have all prescriptions been transferred from [place] to home pharmacy?    Is the patient able to afford medications? [employer] Notable copays: Eliquis $45/mo Eligible patient assistance:     Medication Review:   APIXABAN (ELIQUIS)  Apixaban 5 mg BID initiated on 08/12/21. - Discussed importance of taking medication around the same time everyday  - Reviewed potential DDIs with patient  - Advised patient of medications to avoid (NSAIDs, ASA)  - Educated that Tylenol (acetaminophen) will be the preferred analgesic to prevent risk of bleeding  - Emphasized importance of monitoring for signs and symptoms of bleeding (abnormal bruising, prolonged bleeding, nose bleeds, bleeding from gums, discolored urine, black tarry stools)  - Advised patient to alert all providers of anticoagulation therapy prior to starting a new medication or having a procedure    Follow-up Appointments:  PCP Hospital f/u appt confirmed?  None at this time  [facility] f/u appt confirmed?  Scheduled to see Frann Rider, NP, Neuro on 09/26/21.   If their condition worsens, is the pt aware to call PCP or go to the Emergency Dept.?  yes  Final Patient Assessment:  Pt is taking meds appropriately and uses a pill box.  No bleeding/bruising issues.  Pt has refills available and these have been transferred to [facility].

## 2021-09-26 ENCOUNTER — Other Ambulatory Visit: Payer: Self-pay

## 2021-09-26 ENCOUNTER — Encounter: Payer: Self-pay | Admitting: Adult Health

## 2021-09-26 ENCOUNTER — Ambulatory Visit: Payer: Medicare HMO | Admitting: Adult Health

## 2021-09-26 VITALS — BP 160/81 | HR 80 | Ht 65.5 in | Wt 189.4 lb

## 2021-09-26 DIAGNOSIS — I480 Paroxysmal atrial fibrillation: Secondary | ICD-10-CM

## 2021-09-26 DIAGNOSIS — I639 Cerebral infarction, unspecified: Secondary | ICD-10-CM

## 2021-09-26 DIAGNOSIS — Z09 Encounter for follow-up examination after completed treatment for conditions other than malignant neoplasm: Secondary | ICD-10-CM

## 2021-09-26 NOTE — Progress Notes (Signed)
[facility] [address] [place]. [place] [address] [phone]       HOSPITAL FOLLOW UP NOTE  Mr. Roger Curry Date of Birth:  Jun 10, 1935 Medical Record Number:  [id number]   Reason for Referral:  hospital stroke follow up    SUBJECTIVE:   CHIEF COMPLAINT:  Chief Complaint  Patient presents with   Cerebrovascular Accident    Rm 2, hospital FU, friend- Donell Beers "no concerns"    HPI:   Roger Curry is a 87 y.o. male with a PMHx of Aflutter s/p ablation, CAD, CHF, glaucoma, DDD, DM II, polyneuropathy,  neurogenic bladder with in and out caths, and CKD III  who presented via EMS as a code stroke on 08/10/2021. Patient was at his cardiology appointment that morning when he developed acute aphasia and some left sided weakness.  Personally reviewed hospitalization pertinent progress notes, lab work and imaging.  Evaluated by Dr. Erlinda Hong. CT head negative.  CTA no LVO. Due to deficits, TNK administered. Repeat CTH did show chronic small right frontal and occipital infarcts but no acute infarcts identified. Unable to obtain MRI due to AICD.  Suspected small stroke not seen on imaging likely embolic due to A-fib not on [facility].  EF 35 to 40%.  LDL 50.  A1c 6.8. Per ntoe, AICD interrogated which showed persistent A-fib from 06/26/2021-08/09/2021 and possible A fib 12/2020 but much shorter duration. Per cardiology recommendations, started Eliquis 5 mg twice daily.  Continued Crestor 10 mg daily and home antihypertensive regimen.  Residual deficits of mild dysarthria and BUE<BLE weakness.  Therapy eval's rec'd [facility] PT/OT and discharged home on 12/30.   Today, 09/26/2021, patient being seen for initial hospital follow-up accompanied by his friend, Roger Curry. Overall doing well. Denies any residual deficits or new stroke/TIA symptoms. Returned back to all prior activities.  Compliant on Eliquis and Crestor without side effects.  Blood pressure today elevated, routinely monitors at home and  typically stable. Did have repeat A1c since d/c which was at 6.5 - monitored by PCP.  Routinely followed by cardiology - he has multiple concerns regarding a "flashing light" coming from his defibrillator monitor - he wants to know the cause of this but does report speaking with cardiologist regarding this concern which has been present over the past 6 months per patient. Does report he is due to battery change this summer.  No further concerns at this time.     PERTINENT IMAGING/LABS  Per hospitalization 08/10/2021 CT no acute finding, Chronic small right frontal and occipital infarcts. CTA head and neck, no LVO, athero at b/l ICA siphon and bulbs MRI not able to do due to AICD CT repeat no acute finding 2D Echo EF 35-40% LDL 50 HgbA1c 6.8      ROS:   14 system review of systems performed and negative with exception of those listed in HPI  PMH:  Past Medical History:  Diagnosis Date   Arthritis    Atrial flutter ([place])    ? s/p ablation at [facility]  references in Ct reprt to variant pullmonary venous anatomy and pending pulmonary venous ablation.in 04/2013   Bladder dysfunction    self  caths 4 x daily.    CAD (coronary artery disease)    CHF (congestive heart failure) ([place])    EF on echo 09/2015 35 to 40%.    Closed angle glaucoma    Electrocution 1970s   Renal stones    Spinal stenosis    lumbar and cervical.  Type 2 diabetes mellitus (HCC)     PSH:  Past Surgical History:  Procedure Laterality Date   BALLOON ANGIOPLASTY, ARTERY  1992   BILATERAL HEMI SHOULDER ARTHROPLASTY Left    CHOLECYSTECTOMY N/A 10/08/2015   Procedure: LAPAROSCOPIC CHOLECYSTECTOMY;  Surgeon: Georganna Skeans, MD;  Location: [place] [facility];  Service: General;  Laterality: N/A;   CORONARY ARTERY BYPASS GRAFT  1996   CYSTOSCOPY     ERCP N/A 10/06/2015   Procedure: ENDOSCOPIC RETROGRADE CHOLANGIOPANCREATOGRAPHY (ERCP);  Surgeon: Milus Banister, MD;  Location: [facility];  Service: Endoscopy;  Laterality:  N/A;   ESOPHAGOGASTRODUODENOSCOPY  2014   in [place] ICD  2014   [place], 2009   1997 l spine fusion.  2009 lumbar laminectomy, discectomy, fusion.  says he has had total of 4 surgeries.   TOTAL KNEE ARTHROPLASTY Left 12/15/2019   Procedure: TOTAL KNEE ARTHROPLASTY;  Surgeon: Vickey Huger, MD;  Location: [facility];  Service: Orthopedics;  Laterality: Left;    Social History:  Social History   Socioeconomic History   Marital status: Divorced    Spouse name: Not on file   Number of children: 4   Years of education: Not on file   Highest education level: Not on file  Occupational History   Not on file  Tobacco Use   Smoking status: Former   Smokeless tobacco: Never  Vaping Use   Vaping Use: Never used  Substance and Sexual Activity   Alcohol use: No   Drug use: No   Sexual activity: Not on file  Other Topics Concern   Not on file  Social History Narrative   09/26/21 lives right next to son   Social Determinants of Health   Financial Resource Strain: Not on file  Food Insecurity: Not on file  Transportation Needs: Not on file  Physical Activity: Not on file  Stress: Not on file  Social Connections: Not on file  Intimate Partner Violence: Not on file    Family History:  Family History  Problem Relation Age of Onset   Stroke Brother    Hypertension Brother     Medications:   Current Outpatient Medications on File Prior to Visit  Medication Sig Dispense Refill   apixaban (ELIQUIS) 5 MG TABS tablet Take 1 tablet (5 mg total) by mouth 2 (two) times daily. 60 tablet 2   carvedilol (COREG) 12.5 MG tablet Take 12.5 mg by mouth 2 (two) times daily.     cyanocobalamin (,VITAMIN B-12,) 1000 MCG/ML injection Inject 1,000 mcg into the muscle every 30 (thirty) days.     FERREX 150 150 MG capsule Take 150 mg by mouth daily.     furosemide (LASIX) 40 MG tablet Take 40-60 mg by mouth See admin instructions. 60 mg in the  morning, and 40 mg in the evening     gabapentin (NEURONTIN) 600 MG tablet Take 600 mg by mouth 2 (two) times daily.      isosorbide mononitrate (IMDUR) 60 MG 24 hr tablet Take 60 mg by mouth daily.     magnesium oxide (MAG-OX) 400 (240 Mg) MG tablet Take 400 mg by mouth daily.     Omega-3 Fatty Acids (FISH OIL) 1200 MG CAPS Take 1 capsule by mouth 2 (two) times daily.     omeprazole (PRILOSEC) 20 MG capsule Take 20 mg by mouth daily.     rosuvastatin (CRESTOR) 20 MG tablet Take 10 mg by  mouth daily.     tamsulosin (FLOMAX) 0.4 MG CAPS capsule Take 0.4 mg by mouth daily.     TRUE METRIX BLOOD GLUCOSE TEST test strip 1 each by Other route as needed (blood sugar).     TRULICITY [id number] SOPN Inject 0.75 mg into the skin every Monday.      lisinopril (ZESTRIL) 20 MG tablet Take 20 mg by mouth daily.  (Patient not taking: Reported on 09/26/2021)     No current facility-administered medications on file prior to visit.    Allergies:   Allergies  Allergen Reactions   Penicillins Hives and Itching    Childhood allergy   Quinine Derivatives Hives and Itching      OBJECTIVE:  Physical Exam  Vitals:   09/26/21 1416  BP: (!) 160/81  Pulse: 80  Weight: 189 lb 6.4 oz (85.9 kg)  Height: 5' 5.5" (1.664 m)   Body mass index is 31.04 kg/m. No results found.  Post stroke PHQ 2/9 Depression screen PHQ 2/9 09/26/2021  Decreased Interest 0  Down, Depressed, Hopeless 0  PHQ - 2 Score 0     General: well developed, well nourished, very pleasant elderly Caucasian male, seated, in no evident distress Head: head normocephalic and atraumatic.   Neck: supple with no carotid or supraclavicular bruits Cardiovascular: regular rate and rhythm, no murmurs Musculoskeletal: no deformity Skin:  no rash/petichiae Vascular:  Normal pulses all extremities   Neurologic Exam Mental Status: Awake and fully alert. Mild dysarthria (per patient and friend, baseline).  No evidence of aphasia.  Oriented  to place and time. Recent and remote memory intact. Attention span, concentration and fund of knowledge appropriate. Mood and affect appropriate.  Cranial Nerves: Fundoscopic exam reveals sharp disc margins. Pupils equal, briskly reactive to light. Extraocular movements full without nystagmus. Visual fields full to confrontation. Hearing intact. Facial sensation intact. Face, tongue, palate moves normally and symmetrically.  Motor: Normal bulk and tone. Normal strength in all tested extremity muscles Sensory.: intact to touch , pinprick , position and vibratory sensation.  Coordination: Rapid alternating movements normal in all extremities. Finger-to-nose and heel-to-shin performed accurately bilaterally. Gait and Station: Arises from chair without difficulty. Stance is normal. Gait demonstrates normal stride length and balance without use of AD. Tandem walk and heel toe with mild difficulty.  Reflexes: 1+ and symmetric. Toes downgoing.     NIHSS  1 (dysarthria ?baseline) Modified Rankin  1      ASSESSMENT: Roger Curry is a 87 y.o. year old male with recent possible small stroke not seen on imaging s/p TNK on [id number] likely embolic secondary to A-fib not on [facility]. Vascular risk factors include PAF and A flutter s/p ablation, CAD/MI s/p stenting and AICD, CHF, HTN, HLD, DM, prior strokes on imaging and advanced age.      PLAN:  Embolic stroke :  recovered well without residual deficit although dysarthria noted but baseline per patient and friend.  continue  Eliquis 5 mg twice daily   and Crestor 10 mg daily for secondary stroke prevention.   Discussed secondary stroke prevention measures and importance of close PCP follow up for aggressive stroke risk factor management. I have gone over the pathophysiology of stroke, warning signs and symptoms, risk factors and their management in some detail with instructions to go to the closest emergency room for symptoms of concern. Atrial  fibrillation: Interrogated during recent admission which showed approximately 1 month of A fib (from 06/2021-07/2021). Continue on Eliquis 5 mg twice daily  for CHA2DS2-VASc score of at least 8. Followed by cardiology for monitoring and Eliquis prescribing. Advised to discuss monitor concerns with cardiology  HTN: BP goal <130/90.  Elevated today but stable at home per patient on current regimen per PCP HLD: LDL goal <70. Recent LDL 58 on Crestor 10 mg daily per PCP.  DMII: A1c goal<7.0. Recent A1c 6.5 routinely monitored by PCP.     Overall stable from stroke standpoint without further recommendations and routine follow-up with PCP and cardiology. May follow up as needed     CC:  [facility] provider: Dr. Leonie Man PCP: Myrlene Broker, MD    I spent 58 minutes of face-to-face and non-face-to-face time with patient and friend.  This included previsit chart review including review of recent hospitalization, lab review, study review, electronic health record documentation, patient and friend education regarding recent likely stroke including etiology, secondary stroke prevention measures and importance of managing stroke risk factors, and answered all other questions to patient and friends satisfaction   Frann Rider, AGNP-BC  [facility] [address] [place], [place] [address]  Phone [phone] Fax [phone] Note: This document was prepared with digital dictation and possible smart phrase technology. Any transcriptional errors that result from this process are unintentional.

## 2021-09-26 NOTE — Patient Instructions (Signed)
Continue Eliquis (apixaban) daily  and Crestor for secondary stroke prevention and atrial fibrillation managed by cardiology  Continue to follow up with PCP regarding cholesterol, blood pressure and diabetes management  Maintain strict control of hypertension with blood pressure goal below 130/90, diabetes with hemoglobin A1c goal below 7.0 % and cholesterol with LDL cholesterol (bad cholesterol) goal below 70 mg/dL.   Signs of a Stroke? Follow the BEFAST method:  Balance Watch for a sudden loss of balance, trouble with coordination or vertigo Eyes Is there a sudden loss of vision in one or both eyes? Or double vision?  Face: Ask the person to smile. Does one side of the face droop or is it numb?  Arms: Ask the person to raise both arms. Does one arm drift downward? Is there weakness or numbness of a leg? Speech: Ask the person to repeat a simple phrase. Does the speech sound slurred/strange? Is the person confused ? Time: If you observe any of these signs, call [phone].        Thank you for coming to see [place] at [facility]. I hope we have been able to provide you high quality care today.  You may receive a patient satisfaction survey over the next few weeks. We would appreciate your feedback and comments so that we may continue to improve ourselves and the health of our patients.

## 2021-09-28 NOTE — Progress Notes (Signed)
I agree with the above plan 

## 2021-11-22 ENCOUNTER — Other Ambulatory Visit: Payer: Self-pay

## 2021-11-22 NOTE — Patient Outreach (Signed)
[facility]) Care Management ? ?11/22/2021 ? ?Roger Curry ?Oct 31, 1934 ?[id number] ? ? ?Telephone outreach to patient to obtain mRS was successfully completed. MRS= 0 ? ?[name] ?[place] Management Assistant ?[phone] ? ?

## 2022-01-06 IMAGING — CT CT HEAD CODE STROKE
3 series · 14 of 47 positions shown, 16 images · non-contrast
Comparison: 04/15/2020

CLINICAL DATA: Code stroke.  Neuro deficit, acute, stroke suspected

EXAM:
CT HEAD WITHOUT CONTRAST
TECHNIQUE: Contiguous axial images were obtained from the base of the skull
through the vertex without intravenous contrast.

[Series 3: head 5.0 st · axial · 0.47mm/px · z∈[-170,-24]mm · 8 of 35 slices shown, 10 images]
[im 3/35  brain]
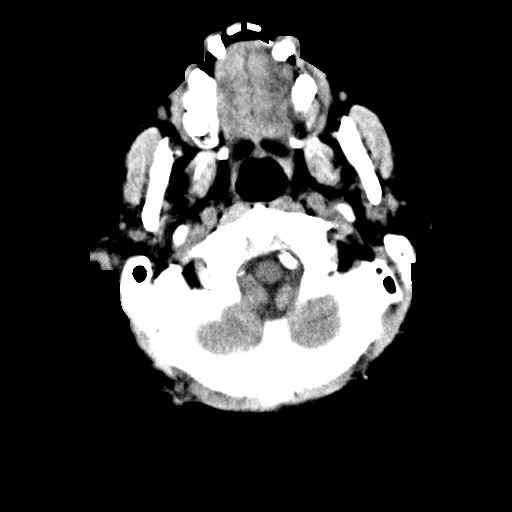
[im 3/35  bone]
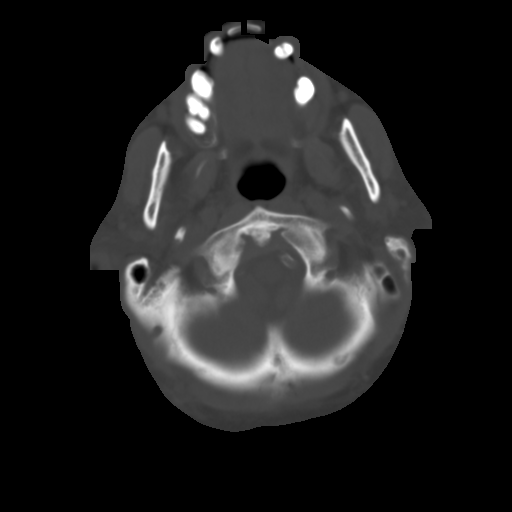
[im 8/35  brain]
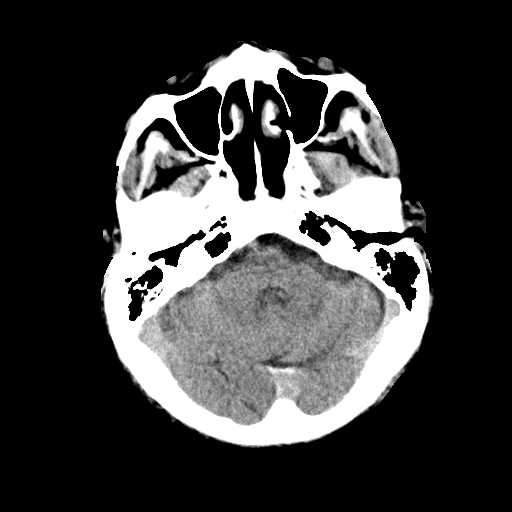
[im 11/35  brain]
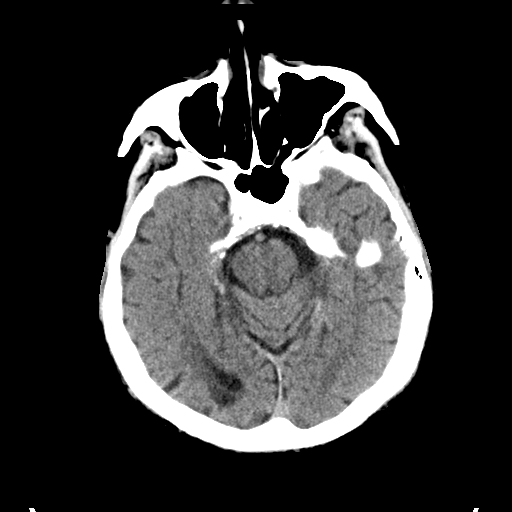
[im 16/35  brain]
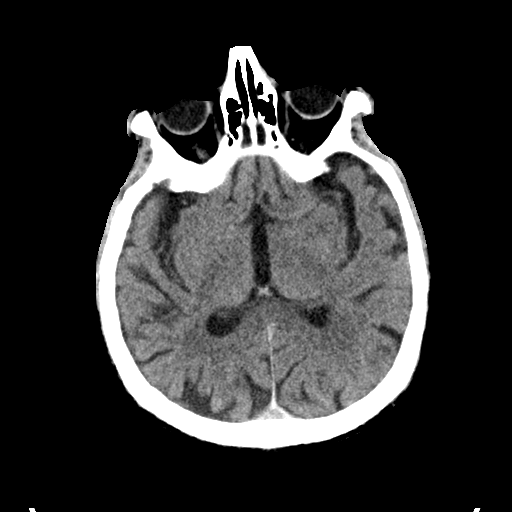
[im 19/35  brain]
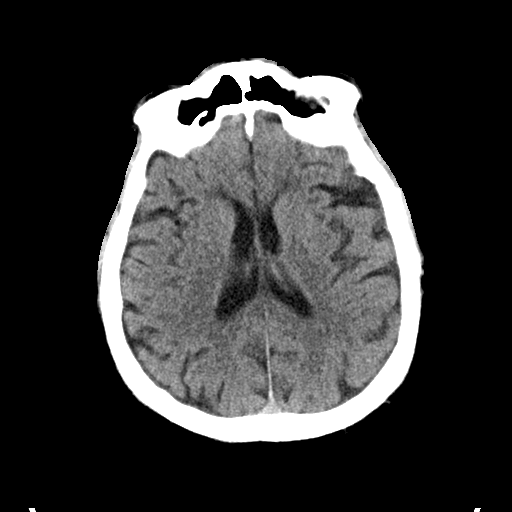
[im 19/35  bone]
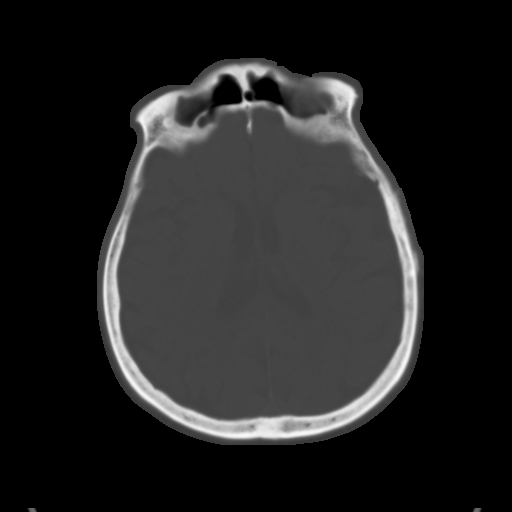
[im 24/35  brain]
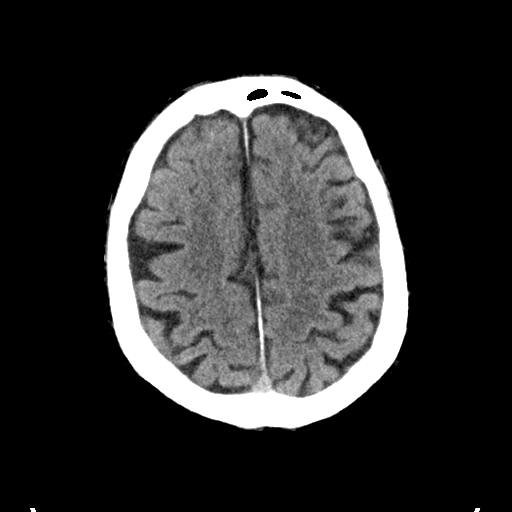
[im 27/35  brain]
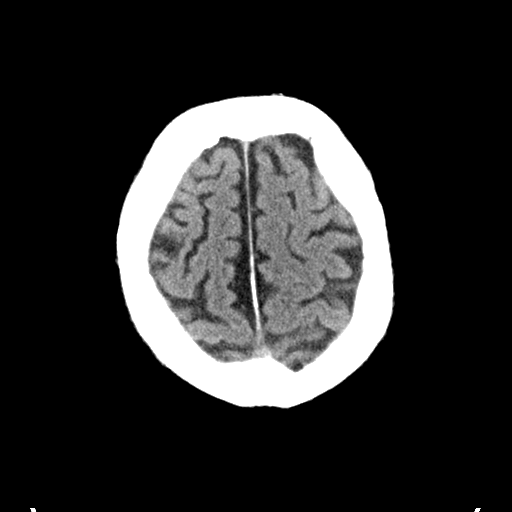
[im 32/35  brain]
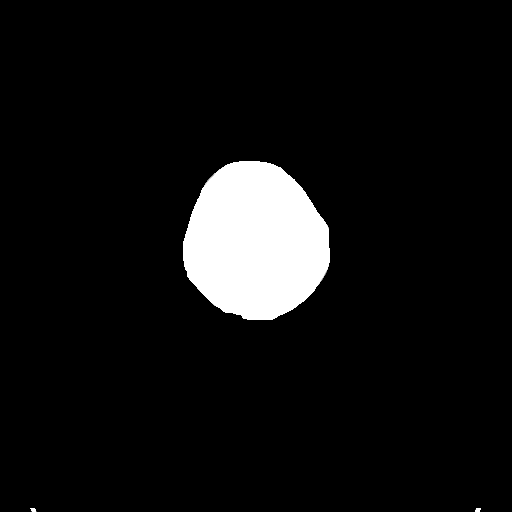

[Series 5: head 3.0 cor st · coronal · 0.34mm/px · 3 of 75 slices shown]
[im 25/75  brain]
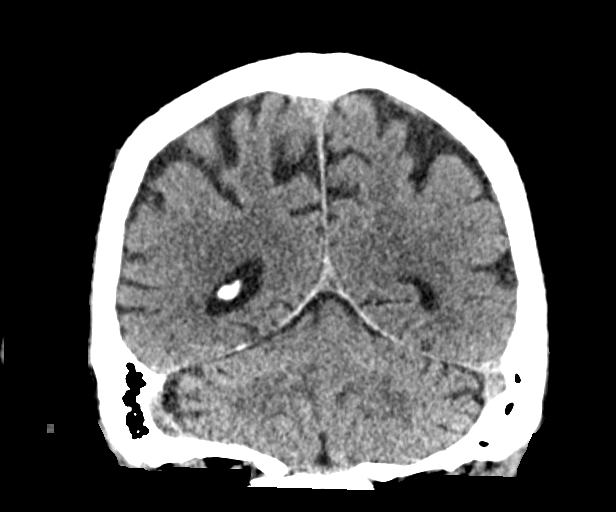
[im 33/75  brain]
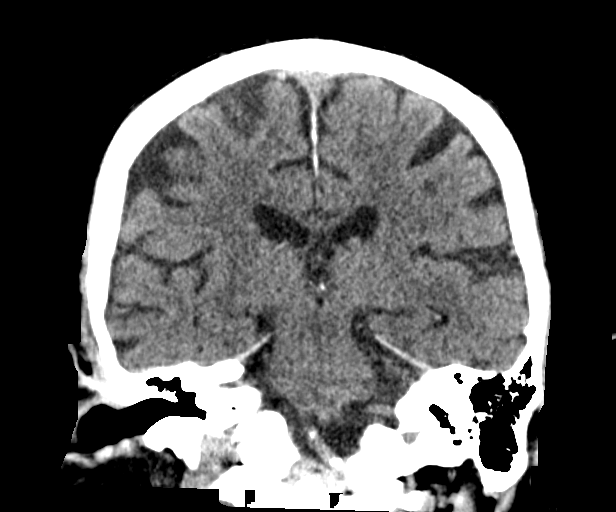
[im 42/75  brain]
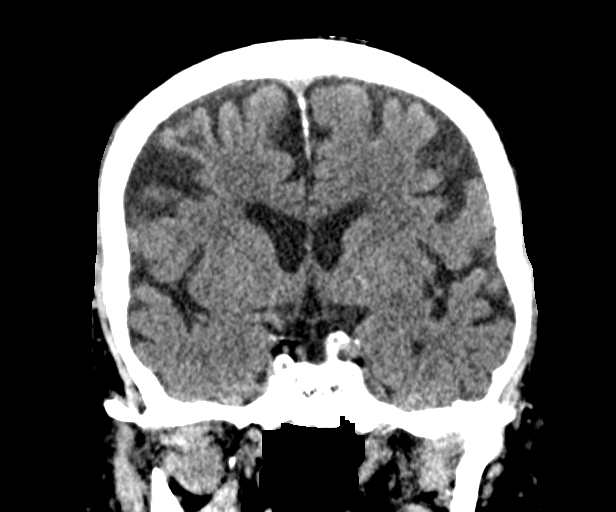

[Series 6: head 3.0 sag st · sagittal · 0.34mm/px · 3 of 67 slices shown]
[im 23/67  brain]
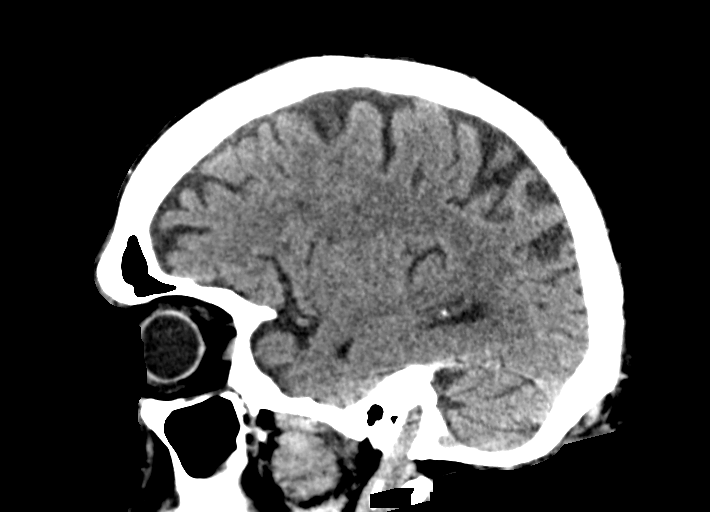
[im 34/67  brain]
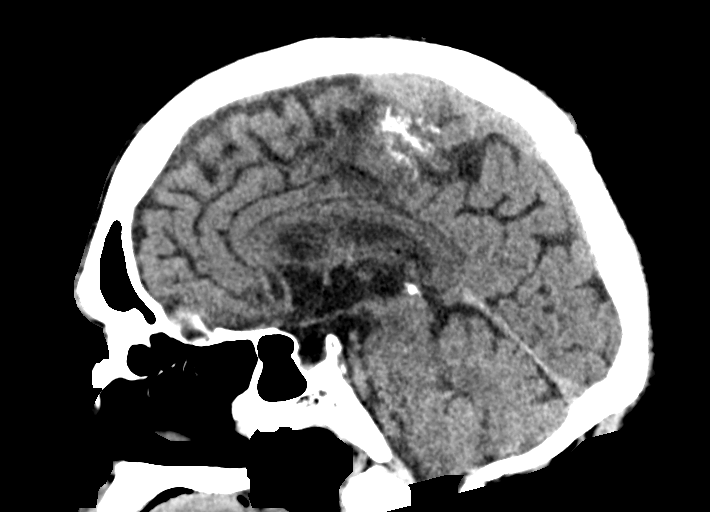
[im 45/67  brain]
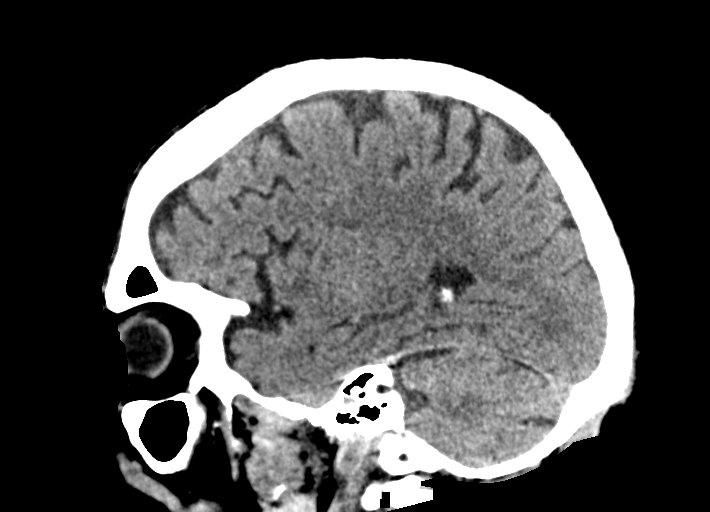

[14 of 47 positions shown; findings below may reference images not displayed]

FINDINGS: Brain: There is no acute intracranial hemorrhage, mass effect, or
edema. Chronic right frontal cortical infarct. New but chronic
appearing right occipital infarct. Additional patchy hypoattenuation
in the supratentorial white matter is nonspecific but probably
reflects similar chronic microvascular ischemic changes. Prominence
of the ventricles and sulci reflects similar parenchymal volume
loss. No extra-axial collection.

Vascular: No hyperdense vessel. There is intracranial
atherosclerotic calcification at the skull base.

Skull: Unremarkable.

Sinuses/Orbits: No acute abnormality.

Other: Mastoid air cells are clear.

ASPECTS (Alberta Stroke Program Early CT Score)

- Ganglionic level infarction (caudate, lentiform nuclei, internal
capsule, insula, M1-M3 cortex): 7

- Supraganglionic infarction (M4-M6 cortex): 3

Total score (0-10 with 10 being normal): 10
IMPRESSION: There is no acute intracranial hemorrhage or evidence of acute
infarction. ASPECT score is 10.

Chronic small right frontal and occipital infarcts. Chronic
microvascular ischemic changes.

These results were communicated to Dr. Shuman at [DATE] on
08/10/2021 by text page via the [employer] messaging system.

## 2022-01-06 IMAGING — CT CT HEAD W/O CM
4 series · 17 of 47 positions shown, 19 images · non-contrast
Comparison: Earlier same day

CLINICAL DATA: Stroke, follow-up

EXAM:
CT HEAD WITHOUT CONTRAST
TECHNIQUE: Contiguous axial images were obtained from the base of the skull
through the vertex without intravenous contrast.

[Series 3: head wo · axial · 0.45mm/px · z∈[-142,-8]mm · 7 of 37 slices shown, 9 images]
[im 5/37  brain]
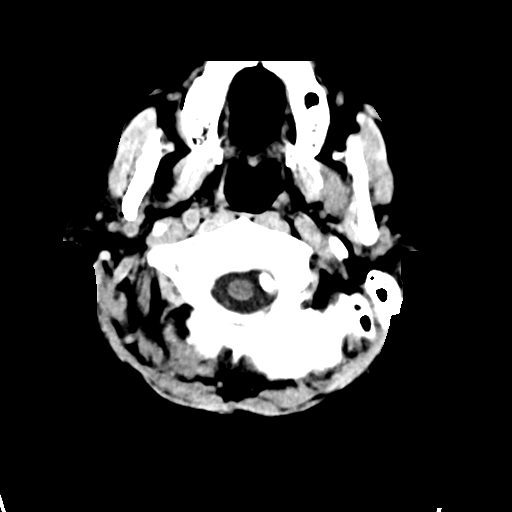
[im 5/37  bone]
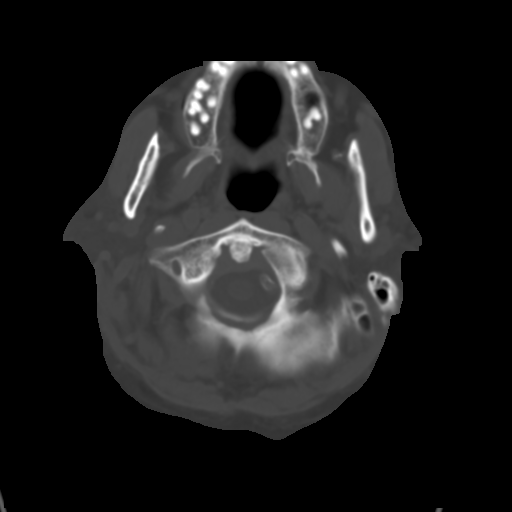
[im 10/37  brain]
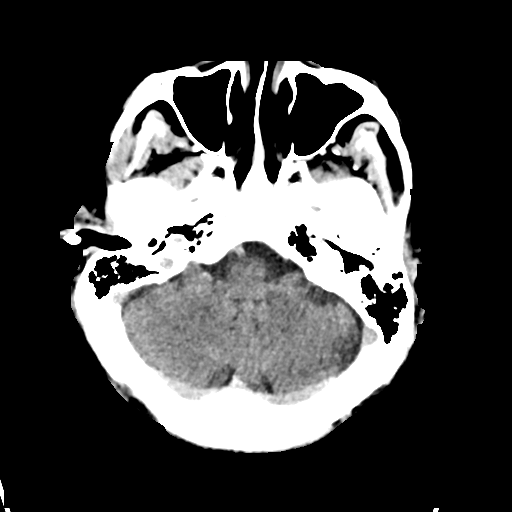
[im 14/37  brain]
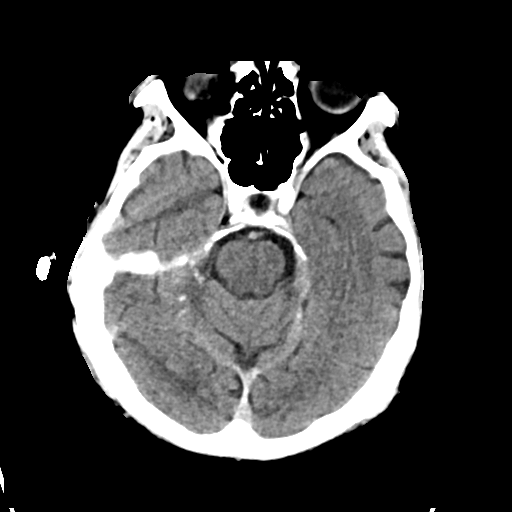
[im 19/37  brain]
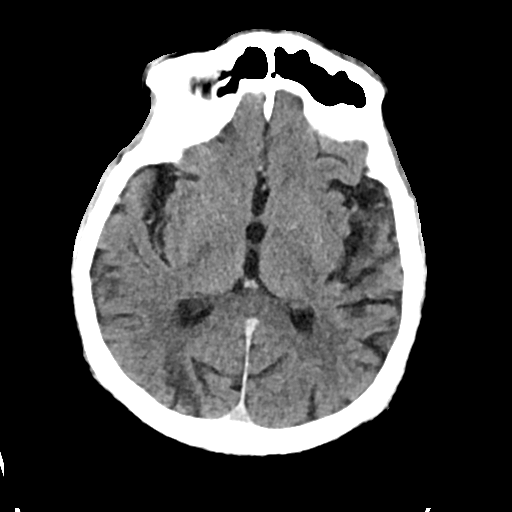
[im 23/37  brain]
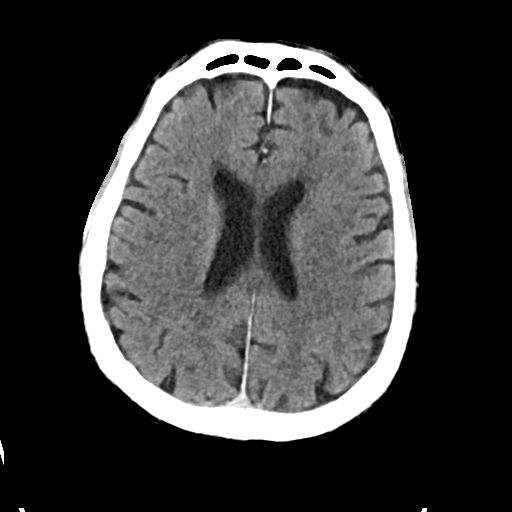
[im 23/37  bone]
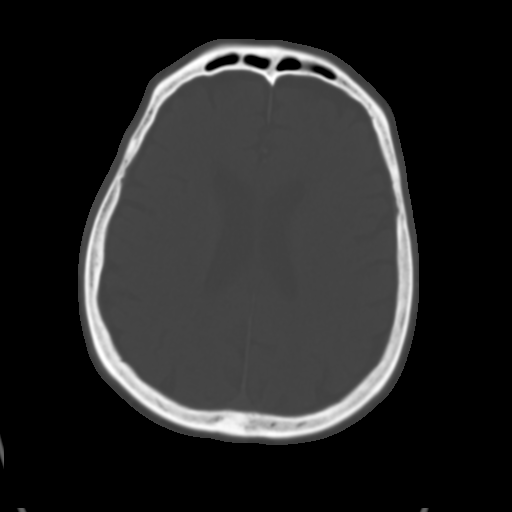
[im 28/37  brain]
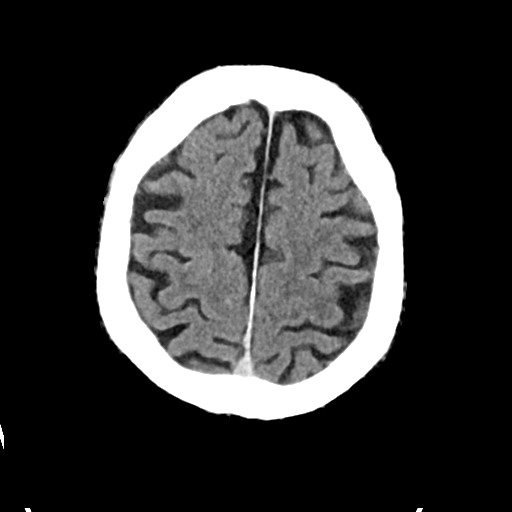
[im 32/37  brain]
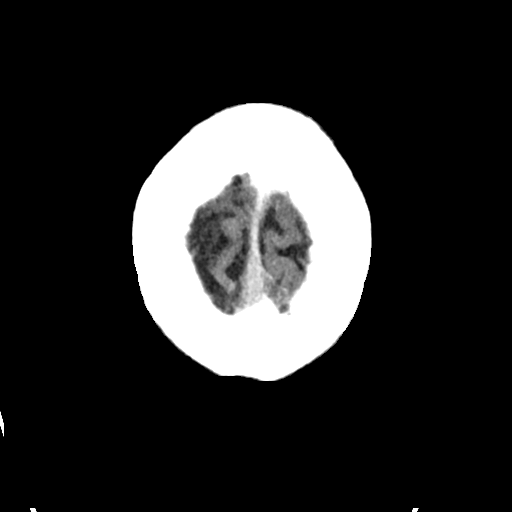

[Series 4: head bone · axial · 0.45mm/px · z∈[-144,-80]mm · 4 of 93 slices shown]
[im 10/93  bone]
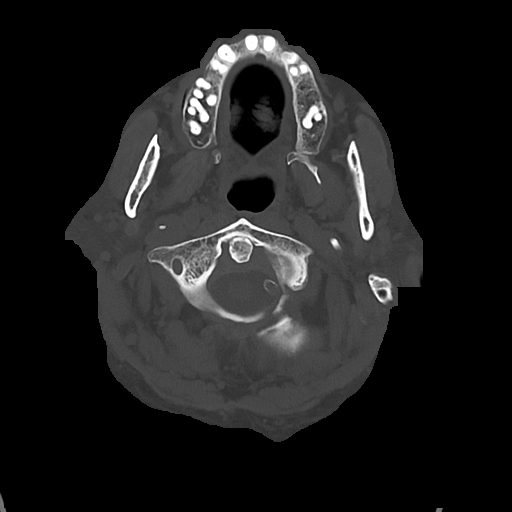
[im 19/93  bone]
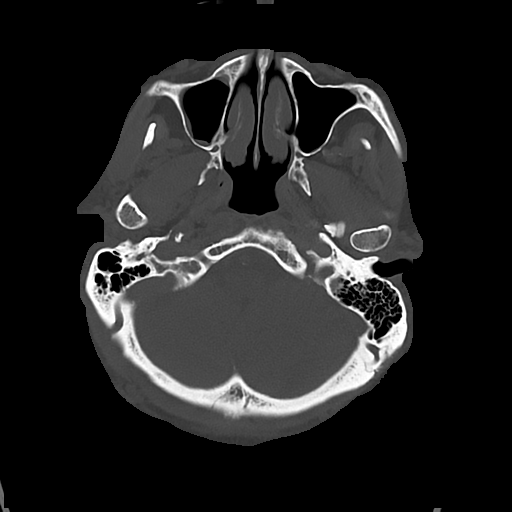
[im 28/93  bone]
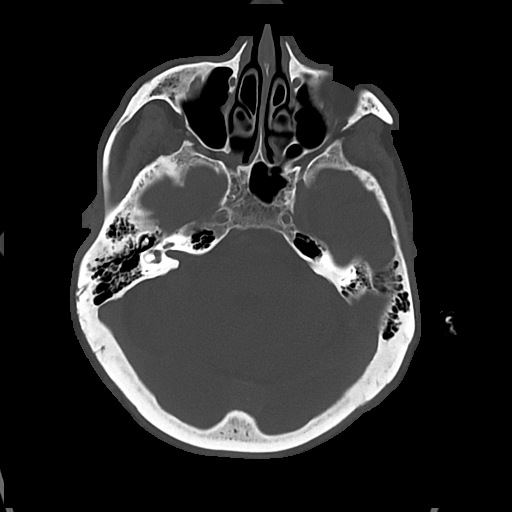
[im 42/93  bone]
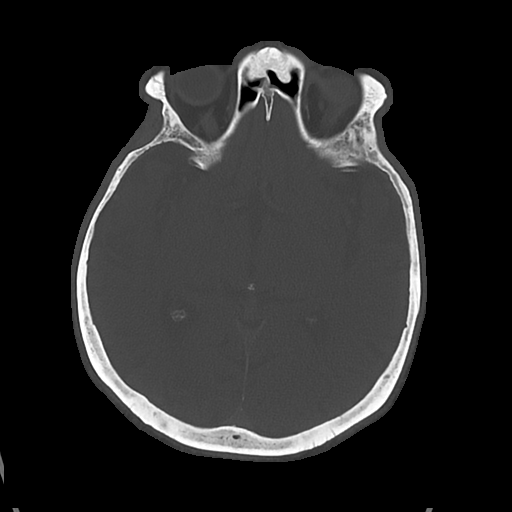

[Series 5: cor soft · coronal · 0.39mm/px · 3 of 70 slices shown]
[im 24/70  brain]
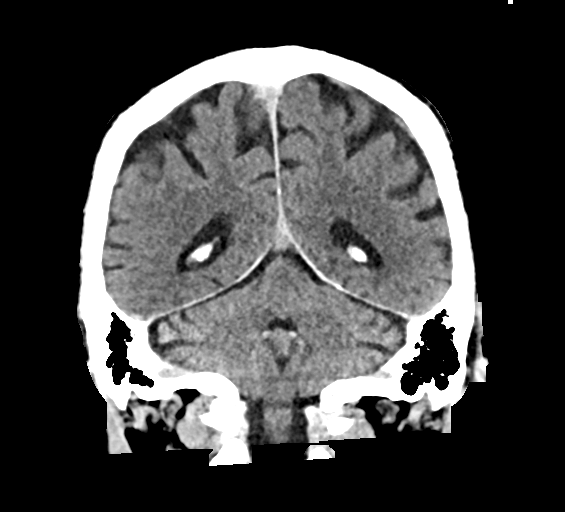
[im 31/70  brain]
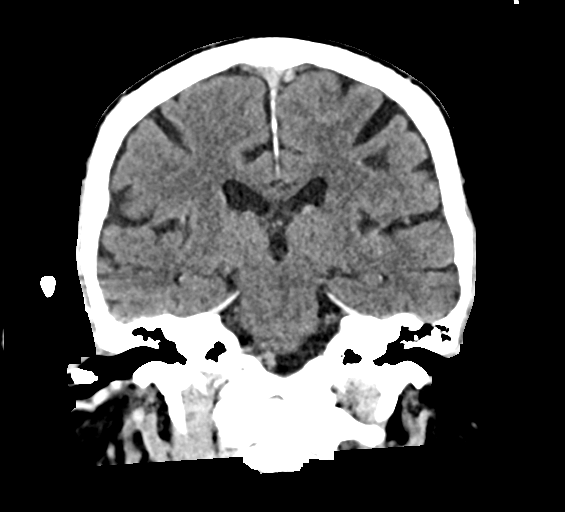
[im 39/70  brain]
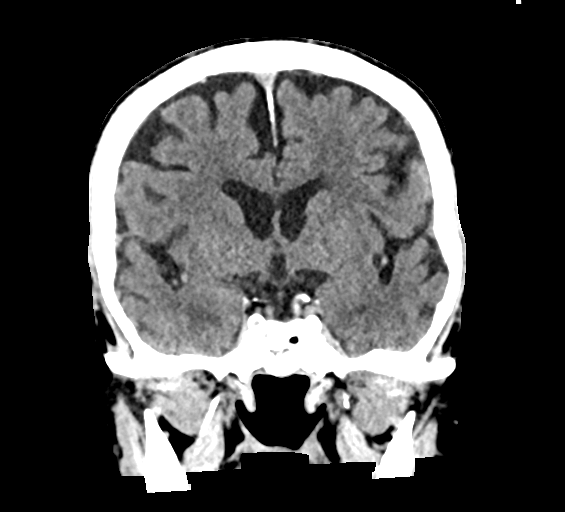

[Series 6: sag soft · sagittal · 0.40mm/px · 3 of 68 slices shown]
[im 23/68  brain]
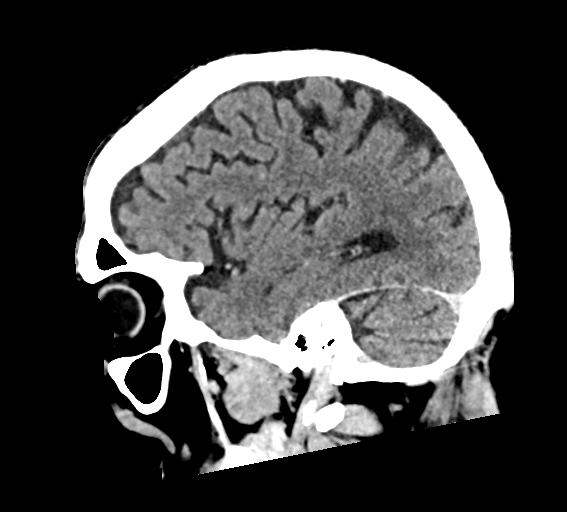
[im 34/68  brain]
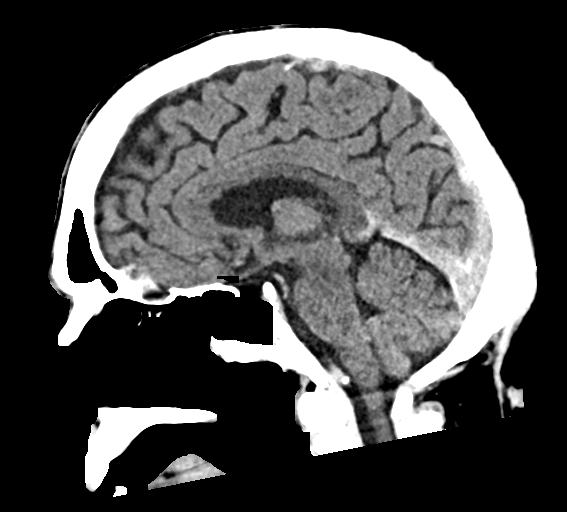
[im 45/68  brain]
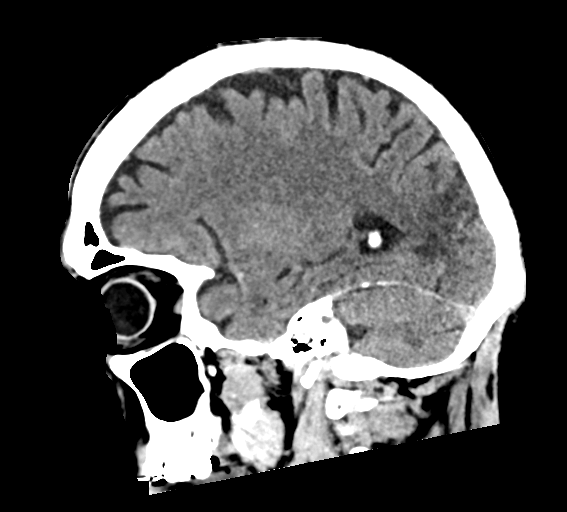

[17 of 47 positions shown; findings below may reference images not displayed]

FINDINGS: Brain: There is no acute intracranial hemorrhage, mass effect, or
edema. No new loss of gray-white differentiation. Small chronic
infarcts in the right frontal and occipital lobes again identified.
There is no extra-axial fluid collection. Ventricles and sulci are
stable in size and configuration.

Vascular: Residual contrast prior CTA.

Skull: Calvarium is unremarkable.

Sinuses/Orbits: No acute finding.

Other: None.
IMPRESSION: No acute intracranial hemorrhage or evidence of evolving recent
infarction.

## 2022-01-07 IMAGING — CT CT HEAD W/O CM
4 series · 16 of 47 positions shown, 18 images · non-contrast
Comparison: CT head dated 1 day prior

CLINICAL DATA: Slurred speech, weakness

EXAM:
CT HEAD WITHOUT CONTRAST
TECHNIQUE: Contiguous axial images were obtained from the base of the skull
through the vertex without intravenous contrast.

[Series 3: head wo · axial · 0.43mm/px · z∈[+1207,+1332]mm · 7 of 35 slices shown, 9 images]
[im 5/35  brain]
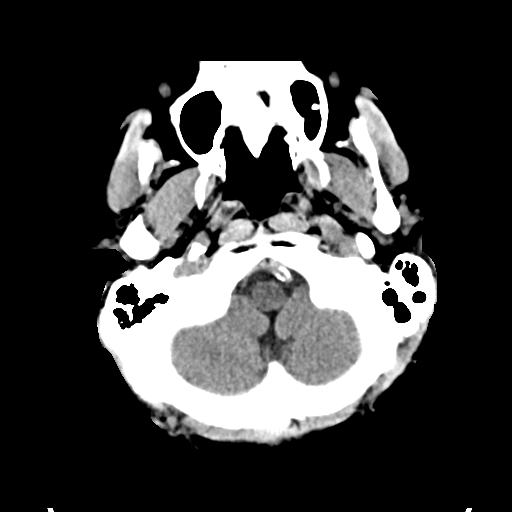
[im 5/35  bone]
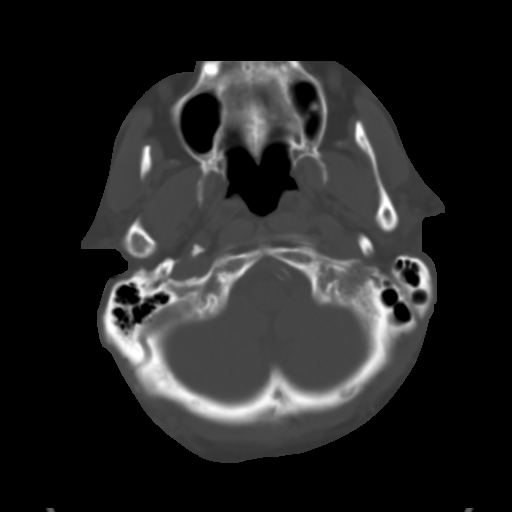
[im 9/35  brain]
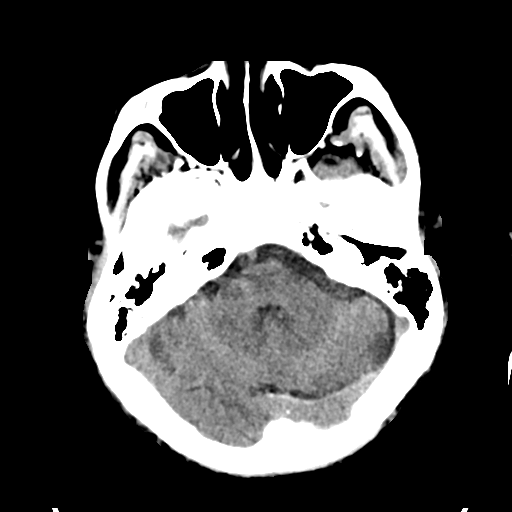
[im 13/35  brain]
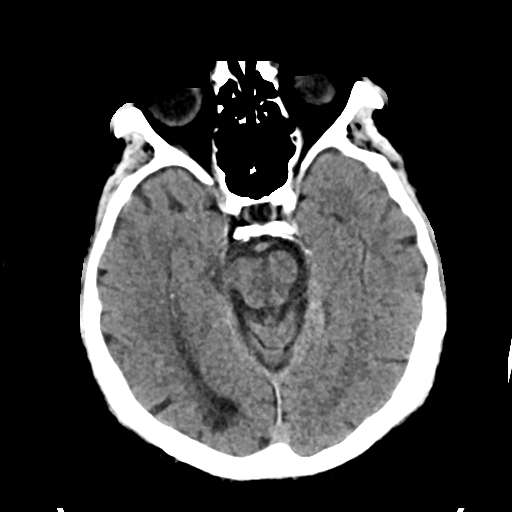
[im 18/35  brain]
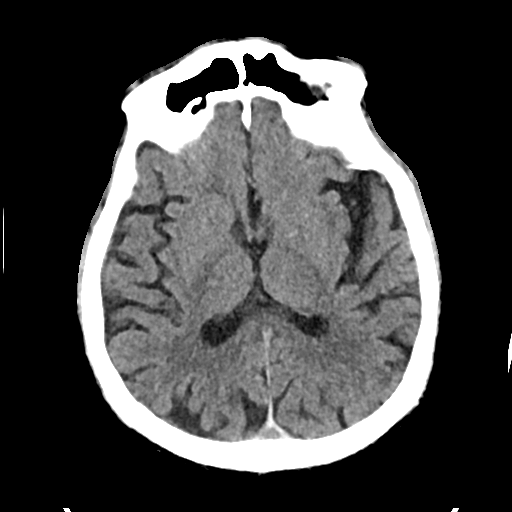
[im 22/35  brain]
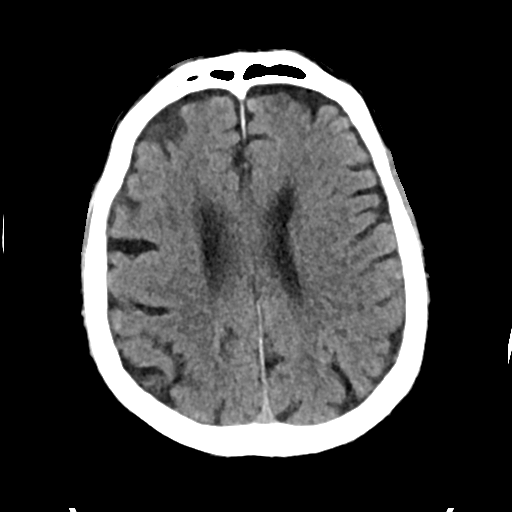
[im 22/35  bone]
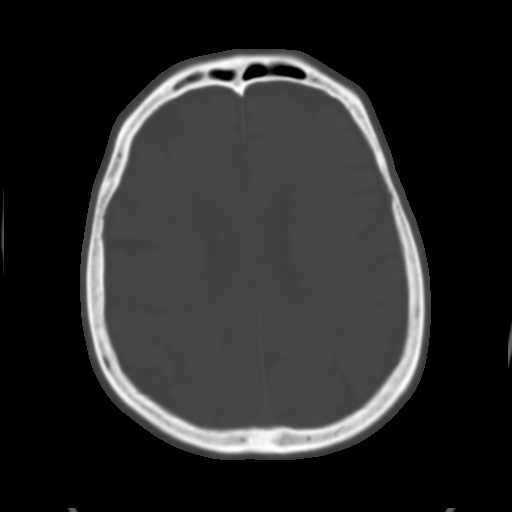
[im 26/35  brain]
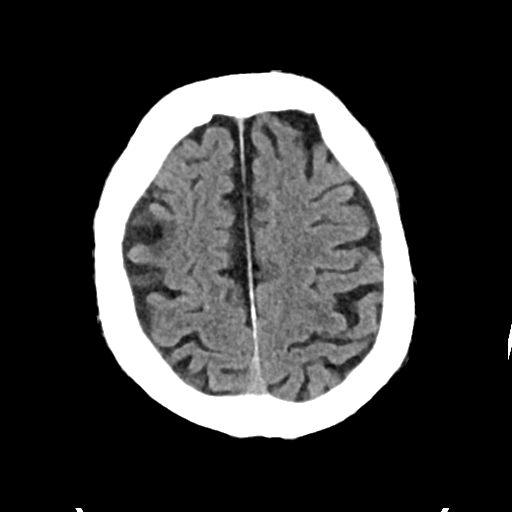
[im 30/35  brain]
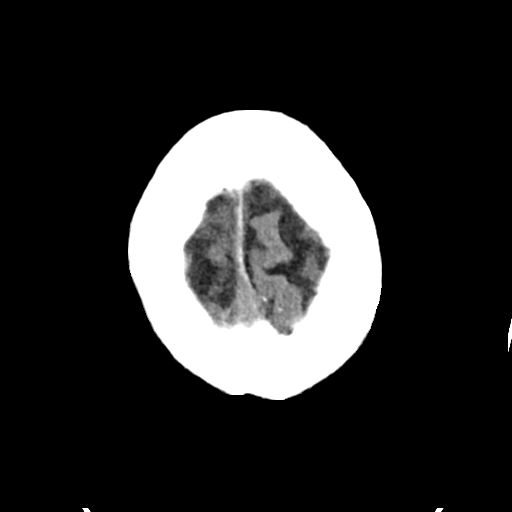

[Series 4: head bone · axial · 0.43mm/px · z∈[+1203,+1237]mm · 3 of 86 slices shown]
[im 9/86  bone]
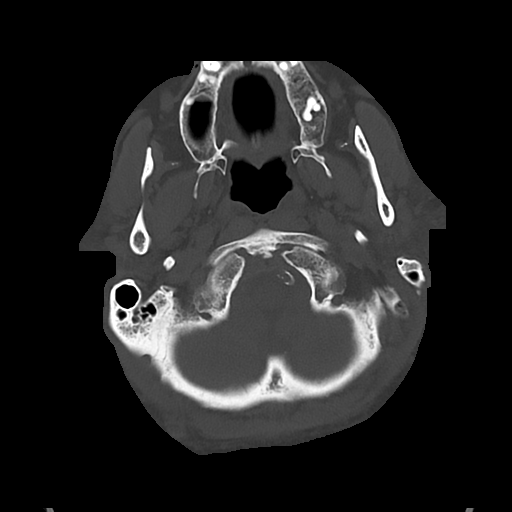
[im 18/86  bone]
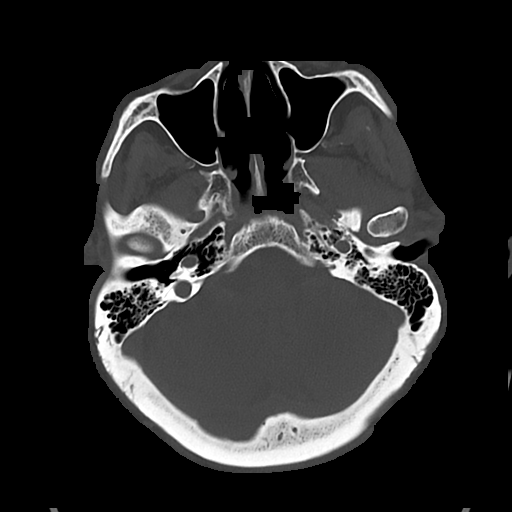
[im 26/86  bone]
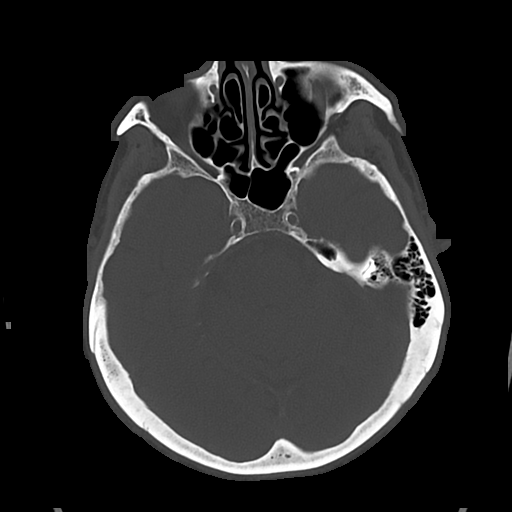

[Series 5: cor soft · coronal · 0.37mm/px · 3 of 77 slices shown]
[im 26/77  brain]
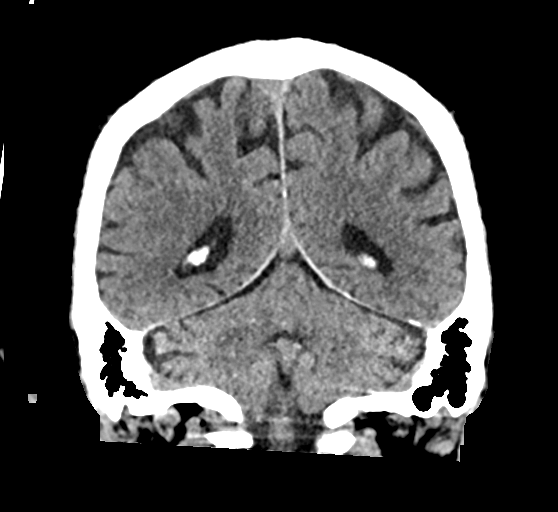
[im 34/77  brain]
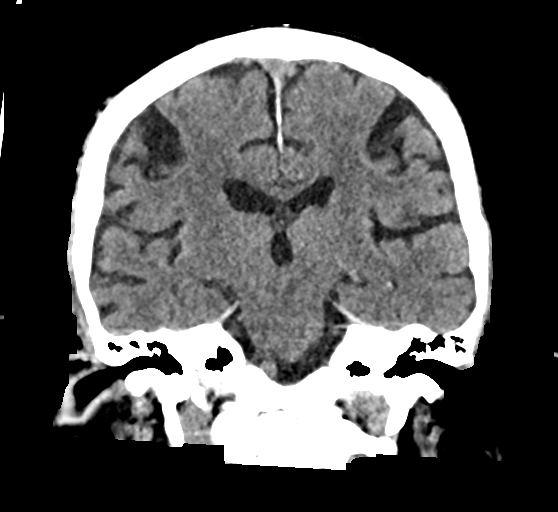
[im 43/77  brain]
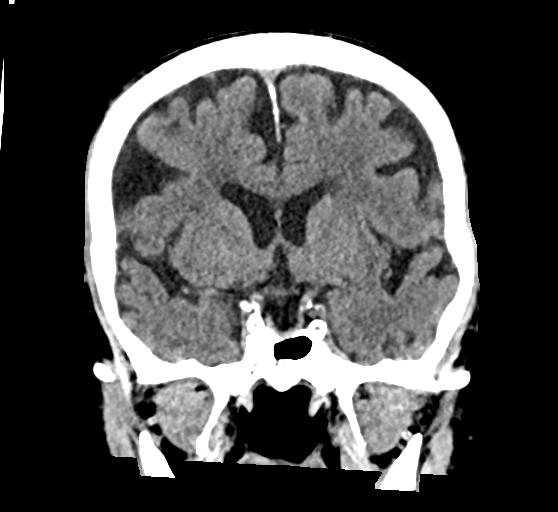

[Series 6: sag soft · sagittal · 0.37mm/px · 3 of 69 slices shown]
[im 23/69  brain]
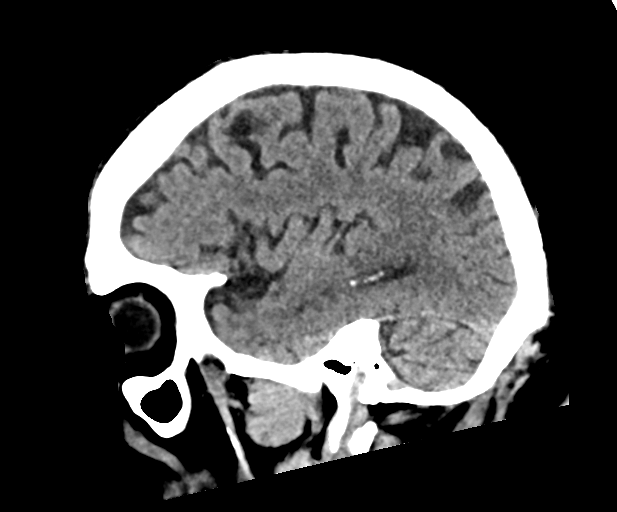
[im 35/69  brain]
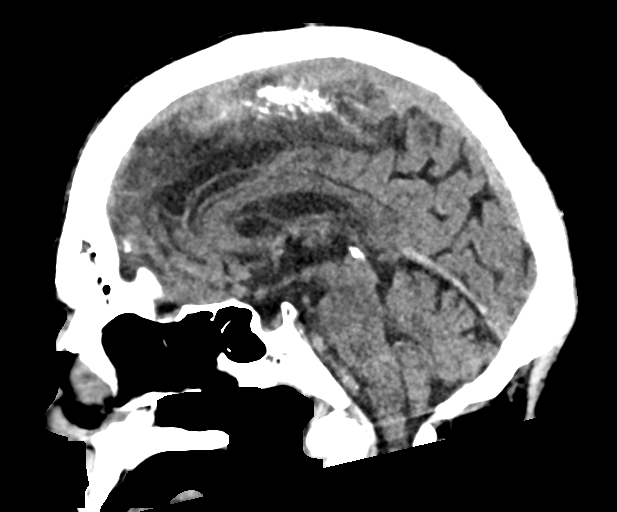
[im 46/69  brain]
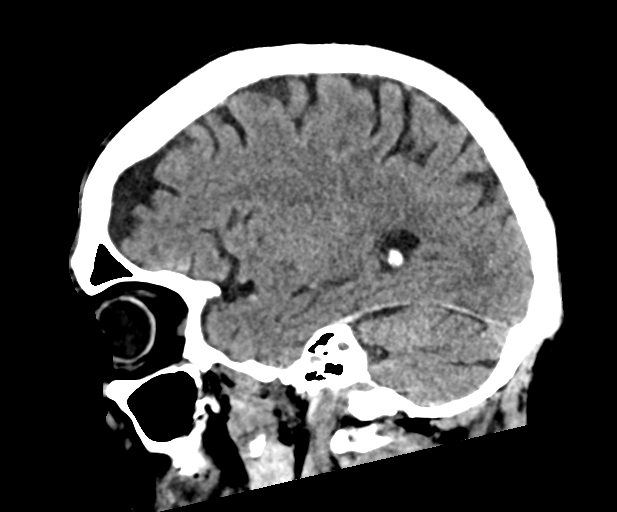

[16 of 47 positions shown; findings below may reference images not displayed]

FINDINGS: Brain: There is no acute intracranial hemorrhage, extra-axial fluid
collection, or acute infarct.

Parenchymal volume is within normal limits. The ventricles are
stable in size. The remote infarcts in the right frontal and
occipital lobes are unchanged. There is mild chronic white matter
microangiopathy.

There is no solid mass lesion.  There is no midline shift.

Vascular: There is calcification of the bilateral cavernous ICAs and
vertebral arteries.

Skull: Normal. Negative for fracture or focal lesion.

Sinuses/Orbits: The paranasal sinuses are clear. Bilateral lens
implants are in place. The globes and orbits are otherwise
unremarkable.

Other: None.
IMPRESSION: Stable appearance of the brain with no evidence of acute
intracranial pathology. No evidence of evolving infarct.

## 2022-04-30 DIAGNOSIS — Z950 Presence of cardiac pacemaker: Secondary | ICD-10-CM
